# Patient Record
Sex: Female | Born: 1955 | Race: Black or African American | Hispanic: No | Marital: Married | Smoking: Never smoker
Health system: Southern US, Community
[De-identification: ages and names within clinical notes are randomized; demographics above are authoritative.]

## PROBLEM LIST (undated history)

## (undated) DIAGNOSIS — E785 Hyperlipidemia, unspecified: Secondary | ICD-10-CM

## (undated) DIAGNOSIS — I10 Essential (primary) hypertension: Secondary | ICD-10-CM

## (undated) DIAGNOSIS — N289 Disorder of kidney and ureter, unspecified: Secondary | ICD-10-CM

## (undated) DIAGNOSIS — E119 Type 2 diabetes mellitus without complications: Secondary | ICD-10-CM

## (undated) DIAGNOSIS — I639 Cerebral infarction, unspecified: Secondary | ICD-10-CM

## (undated) HISTORY — DX: Cerebral infarction, unspecified: I63.9

## (undated) HISTORY — PX: ABDOMINAL HYSTERECTOMY: SHX81

## (undated) HISTORY — DX: Type 2 diabetes mellitus without complications: E11.9

## (undated) HISTORY — DX: Hyperlipidemia, unspecified: E78.5

## (undated) HISTORY — PX: DIALYSIS FISTULA CREATION: SHX611

## (undated) HISTORY — DX: Disorder of kidney and ureter, unspecified: N28.9

## (undated) HISTORY — PX: TUBAL LIGATION: SHX77

## (undated) HISTORY — DX: Essential (primary) hypertension: I10

---

## 2005-12-24 HISTORY — PX: COLONOSCOPY: SHX174

## 2005-12-24 HISTORY — PX: ESOPHAGOGASTRODUODENOSCOPY: SHX1529

## 2012-02-24 DIAGNOSIS — I639 Cerebral infarction, unspecified: Secondary | ICD-10-CM

## 2012-02-24 HISTORY — DX: Cerebral infarction, unspecified: I63.9

## 2013-09-23 HISTORY — PX: COLONOSCOPY: SHX174

## 2014-02-23 HISTORY — PX: ESOPHAGOGASTRODUODENOSCOPY: SHX1529

## 2014-12-05 LAB — HEMOGLOBIN A1C: Hgb A1c MFr Bld: 12.6 % — AB (ref 4.0–6.0)

## 2014-12-07 LAB — HM DIABETES EYE EXAM

## 2015-01-07 ENCOUNTER — Ambulatory Visit (INDEPENDENT_AMBULATORY_CARE_PROVIDER_SITE_OTHER): Payer: BLUE CROSS/BLUE SHIELD | Admitting: "Endocrinology

## 2015-01-07 ENCOUNTER — Encounter: Payer: Self-pay | Admitting: "Endocrinology

## 2015-01-07 VITALS — BP 168/92 | HR 94 | Ht 64.0 in | Wt 165.0 lb

## 2015-01-07 DIAGNOSIS — N186 End stage renal disease: Secondary | ICD-10-CM

## 2015-01-07 DIAGNOSIS — I10 Essential (primary) hypertension: Secondary | ICD-10-CM

## 2015-01-07 DIAGNOSIS — E1122 Type 2 diabetes mellitus with diabetic chronic kidney disease: Secondary | ICD-10-CM | POA: Diagnosis not present

## 2015-01-07 DIAGNOSIS — E785 Hyperlipidemia, unspecified: Secondary | ICD-10-CM

## 2015-01-07 NOTE — Patient Instructions (Signed)

## 2015-01-07 NOTE — Progress Notes (Signed)
Subjective:    Patient ID: Selena Rowe, female    DOB: April 26, 1955,    No past medical history on file. No past surgical history on file. Social History   Social History  . Marital Status: Married    Spouse Name: N/A  . Number of Children: N/A  . Years of Education: N/A   Social History Main Topics  . Smoking status: Never Smoker   . Smokeless tobacco: Not on file  . Alcohol Use: No  . Drug Use: No  . Sexual Activity: Not on file   Other Topics Concern  . Not on file   Social History Narrative  . No narrative on file   No outpatient encounter prescriptions on file as of 01/07/2015.   No facility-administered encounter medications on file as of 01/07/2015.   ALLERGIES: No Known Allergies VACCINATION STATUS:  There is no immunization history on file for this patient.  Diabetes She presents for her follow-up diabetic visit. She has type 2 diabetes mellitus. Onset time: She was diagnosed at approximate age of 59 years preceded by history of gestational diabetes in 59. Her disease course has been worsening. There are no hypoglycemic associated symptoms. Pertinent negatives for hypoglycemia include no confusion, headaches, pallor or seizures. Associated symptoms include fatigue and polydipsia. Pertinent negatives for diabetes include no chest pain and no polyphagia. There are no hypoglycemic complications. Symptoms are worsening. Diabetic complications include a CVA, nephropathy and retinopathy. Risk factors for coronary artery disease include dyslipidemia, diabetes mellitus, hypertension and sedentary lifestyle. Current diabetic treatment includes insulin injections. She is compliant with treatment some of the time. She is following a generally unhealthy diet. She has had a previous visit with a dietitian. She rarely participates in exercise. Her home blood glucose trend is fluctuating dramatically (She did not bring any meter nor blood glucose logs to review.). An ACE  inhibitor/angiotensin II receptor blocker is being taken. Eye exam is current.  Hypertension This is a chronic problem. The current episode started more than 1 year ago. The problem is uncontrolled (Patient states she did not take her blood pressure medications this morning.). Pertinent negatives include no chest pain, headaches, palpitations or shortness of breath. Risk factors for coronary artery disease include dyslipidemia, diabetes mellitus and sedentary lifestyle. Past treatments include beta blockers, diuretics and ACE inhibitors. Hypertensive end-organ damage includes CVA and retinopathy.  Hyperlipidemia This is a chronic problem. The current episode started more than 1 year ago. The problem is uncontrolled. Pertinent negatives include no chest pain, myalgias or shortness of breath. Current antihyperlipidemic treatment includes statins. Risk factors for coronary artery disease include diabetes mellitus, dyslipidemia, hypertension and a sedentary lifestyle.     Review of Systems  Constitutional: Positive for fatigue. Negative for unexpected weight change.  HENT: Negative for trouble swallowing and voice change.   Eyes: Positive for visual disturbance.  Respiratory: Negative for cough, shortness of breath and wheezing.   Cardiovascular: Negative for chest pain, palpitations and leg swelling.  Gastrointestinal: Negative for nausea, vomiting and diarrhea.  Endocrine: Positive for polydipsia. Negative for cold intolerance, heat intolerance and polyphagia.  Musculoskeletal: Negative for myalgias and arthralgias.  Skin: Negative for color change, pallor, rash and wound.  Neurological: Negative for seizures and headaches.  Psychiatric/Behavioral: Negative for suicidal ideas and confusion.    Objective:    BP 168/92 mmHg  Pulse 94  Ht 5\' 4"  (1.626 m)  Wt 165 lb (74.844 kg)  BMI 28.31 kg/m2  SpO2 98%  Wt Readings from  Last 3 Encounters:  01/07/15 165 lb (74.844 kg)    Physical Exam   Constitutional: She is oriented to person, place, and time. She appears well-developed.  HENT:  Head: Normocephalic and atraumatic.  Eyes: EOM are normal.  Neck: Normal range of motion. Neck supple. No tracheal deviation present. No thyromegaly present.  Cardiovascular: Normal rate and regular rhythm.   Pulmonary/Chest: Effort normal and breath sounds normal.  Abdominal: Soft. Bowel sounds are normal. There is no tenderness. There is no guarding.  Musculoskeletal: Normal range of motion. She exhibits no edema.  Neurological: She is alert and oriented to person, place, and time. She has normal reflexes. No cranial nerve deficit. Coordination normal.  Skin: Skin is warm and dry. No rash noted. No erythema. No pallor.  Psychiatric: She has a normal mood and affect. Judgment normal.    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  - Patient has currently uncontrolled symptomatic type 2 DM since  59 years of age,  with most recent A1c of 12.6 %. Recent labs reviewed.   Her diabetes is complicated by end-stage renal disease on dialysis , retinopathy, and cerebrovascular accident and patient remains at a high risk for more acute and chronic complications of diabetes which include CAD, CVA, CKD, retinopathy, and neuropathy. These are all discussed in detail with the patient.  - I have counseled the patient on diet management and weight loss, by adopting a carbohydrate restricted/protein rich diet.  - Suggestion is made for patient to avoid simple carbohydrates   from their diet including Cakes , Desserts, Ice Cream,  Soda (  diet and regular) , Sweet Tea , Candies,  Chips, Cookies, Artificial Sweeteners,   and "Sugar-free" Products . This will help patient to have stable blood glucose profile and potentially avoid unintended weight gain.  - I encouraged the  patient to switch to  unprocessed or minimally processed complex starch and increased protein intake (animal or plant source), fruits, and vegetables.  - Patient is advised to stick to a routine mealtimes to eat 3 meals  a day and avoid unnecessary snacks ( to snack only to correct hypoglycemia).  - The patient will be scheduled with Jearld Fenton, RDN, CDE for individualized DM education.  - I have approached patient with the following individualized plan to manage diabetes and patient agrees:   - I  will proceed to adjust her basal insulin to Levemir 60 units QHS, and prandial insulin NovoLog 15 units TIDAC for pre-meal BG readings of 90-150mg /dl, plus patient specific correction dose for unexpected hyperglycemia above 150mg /dl, associated with strict monitoring of glucose  AC and HS. - Patient is warned not to take insulin without proper monitoring per orders. -Adjustment parameters are given for hypo and hyperglycemia in writing. -Patient is encouraged to call clinic for blood glucose levels less than 70 or above 300 mg /dl.  -Patient is not a candidate for metformin andSGLT2 inhibitors due to CKD.  - Patient will be considered for incretin therapy as appropriate next visit. - Patient specific target  A1c;  LDL, HDL, Triglycerides, and  Waist Circumference were discussed in detail.  2) BP/HTN: Uncontrolled due to the fact that she did not take her blood pressure medications this morning. I advised her to continue carvedilol 12.5 mg by mouth twice a day lisinopril 10 mg by mouth daily. 3) Lipids/HPL:  Controlled unknown , continue  statins. 4)  Weight/Diet: CDE Consult will be initiated , exercise, and detailed carbohydrates information provided.  5) Chronic Care/Health Maintenance:  -Patient is on ACEI/ARB and Statin medications and encouraged to continue to follow up with Ophthalmology, Podiatrist at least yearly or according to recommendations, and advised to   stay away from smoking. I  have recommended yearly flu vaccine and pneumonia vaccination at least every 5 years; moderate intensity exercise for up to 150 minutes weekly; and  sleep for at least 7 hours a day.   Patient to bring meter and  blood glucose logs during their next visit.   I advised patient to maintain close follow up with their PCP for primary care needs. Follow up plan: No Follow-up on file.  Glade Lloyd, MD Phone: [phone]  Fax: [phone]   01/07/2015, 8:44 AM

## 2015-01-14 ENCOUNTER — Encounter: Payer: Self-pay | Admitting: "Endocrinology

## 2015-01-14 ENCOUNTER — Ambulatory Visit (INDEPENDENT_AMBULATORY_CARE_PROVIDER_SITE_OTHER): Payer: BLUE CROSS/BLUE SHIELD | Admitting: "Endocrinology

## 2015-01-14 VITALS — BP 166/90 | HR 86 | Ht 64.0 in | Wt 169.0 lb

## 2015-01-14 DIAGNOSIS — E782 Mixed hyperlipidemia: Secondary | ICD-10-CM | POA: Insufficient documentation

## 2015-01-14 DIAGNOSIS — N186 End stage renal disease: Secondary | ICD-10-CM | POA: Diagnosis not present

## 2015-01-14 DIAGNOSIS — E785 Hyperlipidemia, unspecified: Secondary | ICD-10-CM | POA: Diagnosis not present

## 2015-01-14 DIAGNOSIS — E1122 Type 2 diabetes mellitus with diabetic chronic kidney disease: Secondary | ICD-10-CM | POA: Insufficient documentation

## 2015-01-14 DIAGNOSIS — I10 Essential (primary) hypertension: Secondary | ICD-10-CM | POA: Diagnosis not present

## 2015-01-14 NOTE — Patient Instructions (Signed)

## 2015-01-14 NOTE — Progress Notes (Signed)
Subjective:    Patient ID: Selena Rowe, female    DOB: Jun 03, 1955,    Past Medical History  Diagnosis Date  . Diabetes mellitus, type II ([place])   . Hypertension   . Hyperlipidemia   . Stroke ([place])   . Kidney disease    Past Surgical History  Procedure Laterality Date  . Cesarean section    . Abdominal hysterectomy    . Dialysis fistula creation     Social History   Social History  . Marital Status: Married    Spouse Name: N/A  . Number of Children: N/A  . Years of Education: N/A   Social History Main Topics  . Smoking status: Never Smoker   . Smokeless tobacco: None  . Alcohol Use: No  . Drug Use: No  . Sexual Activity: Not Asked   Other Topics Concern  . None   Social History Narrative   Outpatient Encounter Prescriptions as of 01/14/2015  Medication Sig  . aspirin EC 81 MG tablet Take 81 mg by mouth daily.  Marland Kitchen atorvastatin (LIPITOR) 40 MG tablet Take 40 mg by mouth daily.  . calcium acetate (PHOSLO) 667 MG capsule Take by mouth 3 (three) times daily with meals.  . carvedilol (COREG) 12.5 MG tablet Take 12.5 mg by mouth 2 (two) times daily with a meal.  . ergocalciferol (VITAMIN D2) 50000 UNITS capsule Take 50,000 Units by mouth once a week.  . furosemide (LASIX) 40 MG tablet Take 40 mg by mouth 2 (two) times daily.  . insulin aspart (NOVOLOG FLEXPEN) 100 UNIT/ML FlexPen Inject 10-16 Units into the skin 3 (three) times daily with meals.  . Insulin Detemir (LEVEMIR FLEXPEN) 100 UNIT/ML Pen Inject 60 Units into the skin at bedtime.  Marland Kitchen lisinopril (PRINIVIL,ZESTRIL) 40 MG tablet Take 40 mg by mouth daily.  . multivitamin (RENA-VIT) TABS tablet Take 1 tablet by mouth daily.  . vitamin B-12 (CYANOCOBALAMIN) 500 MCG tablet Take 500 mcg by mouth daily.   No facility-administered encounter medications on file as of 01/14/2015.   ALLERGIES: No Known Allergies VACCINATION STATUS:  There is no immunization history on file for this patient.  Diabetes She presents  for her follow-up diabetic visit. She has type 2 diabetes mellitus. Onset time: She was diagnosed at approximate age of 23 years preceded by history of gestational diabetes in 87. Her disease course has been fluctuating. There are no hypoglycemic associated symptoms. Pertinent negatives for hypoglycemia include no confusion, headaches, pallor or seizures. (She had one episode of hypoglycemia to 38 associated with symptoms including confusion.) Associated symptoms include fatigue and polydipsia. Pertinent negatives for diabetes include no chest pain and no polyphagia. There are no hypoglycemic complications. Symptoms are worsening. Diabetic complications include a CVA, nephropathy and retinopathy. Risk factors for coronary artery disease include dyslipidemia, diabetes mellitus, hypertension and sedentary lifestyle. Current diabetic treatment includes insulin injections. She is compliant with treatment some of the time. Her weight is stable. She is following a generally unhealthy diet. She has had a previous visit with a dietitian. She rarely participates in exercise. Her home blood glucose trend is fluctuating dramatically (She did not bring any meter nor blood glucose logs to review.). An ACE inhibitor/angiotensin II receptor blocker is being taken. Eye exam is current.  Hypertension This is a chronic problem. The current episode started more than 1 year ago. The problem is uncontrolled (Patient states she did not take her blood pressure medications this morning.). Pertinent negatives include no chest pain, headaches, palpitations  or shortness of breath. Risk factors for coronary artery disease include dyslipidemia, diabetes mellitus and sedentary lifestyle. Past treatments include beta blockers, diuretics and ACE inhibitors. Hypertensive end-organ damage includes CVA and retinopathy.  Hyperlipidemia This is a chronic problem. The current episode started more than 1 year ago. The problem is uncontrolled.  Pertinent negatives include no chest pain, myalgias or shortness of breath. Current antihyperlipidemic treatment includes statins. Risk factors for coronary artery disease include diabetes mellitus, dyslipidemia, hypertension and a sedentary lifestyle.     Review of Systems  Constitutional: Positive for fatigue. Negative for unexpected weight change.  HENT: Negative for trouble swallowing and voice change.   Eyes: Positive for visual disturbance.  Respiratory: Negative for cough, shortness of breath and wheezing.   Cardiovascular: Negative for chest pain, palpitations and leg swelling.  Gastrointestinal: Negative for nausea, vomiting and diarrhea.  Endocrine: Positive for polydipsia. Negative for cold intolerance, heat intolerance and polyphagia.  Musculoskeletal: Negative for myalgias and arthralgias.  Skin: Negative for color change, pallor, rash and wound.  Neurological: Negative for seizures and headaches.  Psychiatric/Behavioral: Negative for suicidal ideas and confusion.    Objective:    BP 166/90 mmHg  Pulse 86  Ht 5\' 4"  (1.626 m)  Wt 169 lb (76.658 kg)  BMI 28.99 kg/m2  SpO2 96%  Wt Readings from Last 3 Encounters:  01/14/15 169 lb (76.658 kg)  01/07/15 165 lb (74.844 kg)    Physical Exam  Constitutional: She is oriented to person, place, and time. She appears well-developed.  HENT:  Head: Normocephalic and atraumatic.  Eyes: EOM are normal.  Neck: Normal range of motion. Neck supple. No tracheal deviation present. No thyromegaly present.  Cardiovascular: Normal rate and regular rhythm.   Pulmonary/Chest: Effort normal and breath sounds normal.  Abdominal: Soft. Bowel sounds are normal. There is no tenderness. There is no guarding.  Musculoskeletal: Normal range of motion. She exhibits no edema.  Neurological: She is alert and oriented to person, place, and time. She has normal reflexes. No cranial nerve deficit. Coordination normal.  Skin: Skin is warm and dry. No  rash noted. No erythema. No pallor.  Psychiatric: She has a normal mood and affect. Judgment normal.    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  - Patient has currently uncontrolled symptomatic type 2 DM since  59 years of age,  with most recent A1c of 12.6 %. Recent labs reviewed.   Her diabetes is complicated by end-stage renal disease on dialysis , retinopathy, and cerebrovascular accident and patient remains at a high risk for more acute and chronic complications of diabetes which include CAD, CVA, CKD, retinopathy, and neuropathy. These are all discussed in detail with the patient.  - I have counseled the patient on diet management and weight loss, by adopting a carbohydrate restricted/protein rich diet.  - Suggestion is made for patient to avoid simple carbohydrates   from their diet including Cakes , Desserts, Ice Cream,  Soda (  diet and regular) , Sweet Tea , Candies,  Chips, Cookies, Artificial Sweeteners,   and "Sugar-free" Products . This will help patient to have stable blood glucose profile and potentially avoid unintended weight gain.  - I encouraged the patient to switch to  unprocessed or minimally processed complex starch and increased protein intake (animal or plant source), fruits, and vegetables.  - Patient is advised to stick to a  routine mealtimes to eat 3 meals  a day and avoid unnecessary snacks ( to snack only to correct hypoglycemia).  - The patient will be scheduled with Jearld Fenton, RDN, CDE for individualized DM education.  - I have approached patient with the following individualized plan to manage diabetes and patient agrees:   - I  will proceed with Levemir 60 units QHS, and lower  NovoLog to 10 units [facility] for pre-meal BG readings of 90-150mg /dl, plus patient specific correction dose for unexpected hyperglycemia above  150mg /dl, associated with strict monitoring of glucose  AC and HS. - Patient is warned not to take insulin without proper monitoring per orders. -Adjustment parameters are given for hypo and hyperglycemia in writing. -Patient is encouraged to call clinic for blood glucose levels less than 70 or above 300 mg /dl.  -Patient is not a candidate for metformin andSGLT2 inhibitors due to CKD.  - Patient will be considered for incretin therapy as appropriate next visit. - Patient specific target  A1c;  LDL, HDL, Triglycerides, and  Waist Circumference were discussed in detail.  2) BP/HTN: Uncontrolled due to the fact that she did not take her blood pressure medications this morning. I advised her to continue carvedilol 12.5 mg by mouth twice a day lisinopril 10 mg by mouth daily. 3) Lipids/HPL:  Controlled unknown , continue statins. 4)  Weight/Diet: CDE Consult will be initiated , exercise, and detailed carbohydrates information provided.  5) Chronic Care/Health Maintenance:  -Patient is on ACEI/ARB and Statin medications and encouraged to continue to follow up with Ophthalmology, Podiatrist at least yearly or according to recommendations, and advised to   stay away from smoking. I have recommended yearly flu vaccine and pneumonia vaccination at least every 5 years; moderate intensity exercise for up to 150 minutes weekly; and  sleep for at least 7 hours a day.   Patient to bring meter and  blood glucose logs during their next visit.   I advised patient to maintain close follow up with their PCP for primary care needs. Follow up plan: Return in about 2 weeks (around 01/28/2015) for diabetes, high blood pressure, high cholesterol, follow up with meter and logs- no labs.  Glade Lloyd, MD Phone: [phone]  Fax: [phone]   01/14/2015, 7:39 PM

## 2015-01-30 ENCOUNTER — Ambulatory Visit (INDEPENDENT_AMBULATORY_CARE_PROVIDER_SITE_OTHER): Payer: Medicare Other | Admitting: "Endocrinology

## 2015-01-30 ENCOUNTER — Encounter: Payer: Self-pay | Admitting: "Endocrinology

## 2015-01-30 VITALS — BP 149/79 | HR 88 | Ht 64.0 in | Wt 169.0 lb

## 2015-01-30 DIAGNOSIS — N184 Chronic kidney disease, stage 4 (severe): Secondary | ICD-10-CM

## 2015-01-30 DIAGNOSIS — N186 End stage renal disease: Secondary | ICD-10-CM | POA: Diagnosis not present

## 2015-01-30 DIAGNOSIS — E1122 Type 2 diabetes mellitus with diabetic chronic kidney disease: Secondary | ICD-10-CM | POA: Insufficient documentation

## 2015-01-30 DIAGNOSIS — I10 Essential (primary) hypertension: Secondary | ICD-10-CM

## 2015-01-30 DIAGNOSIS — E785 Hyperlipidemia, unspecified: Secondary | ICD-10-CM

## 2015-01-30 MED ORDER — INSULIN DETEMIR 100 UNIT/ML FLEXPEN: 60.0000 [IU] | PEN_INJECTOR | Freq: Every day | SUBCUTANEOUS | Status: DC

## 2015-01-30 MED ORDER — INSULIN ASPART 100 UNIT/ML FLEXPEN: 12.0000 [IU] | PEN_INJECTOR | Freq: Three times a day (TID) | SUBCUTANEOUS | Status: DC

## 2015-01-30 NOTE — Patient Instructions (Signed)

## 2015-01-30 NOTE — Progress Notes (Signed)
Subjective:    Patient ID: Selena Rowe, female    DOB: 01-08-1956,    Past Medical History  Diagnosis Date  . Diabetes mellitus, type II ([place])   . Hypertension   . Hyperlipidemia   . Stroke ([place])   . Kidney disease    Past Surgical History  Procedure Laterality Date  . Cesarean section    . Abdominal hysterectomy    . Dialysis fistula creation     Social History   Social History  . Marital Status: Married    Spouse Name: N/A  . Number of Children: N/A  . Years of Education: N/A   Social History Main Topics  . Smoking status: Never Smoker   . Smokeless tobacco: None  . Alcohol Use: No  . Drug Use: No  . Sexual Activity: Not Asked   Other Topics Concern  . None   Social History Narrative   Outpatient Encounter Prescriptions as of 01/30/2015  Medication Sig  . aspirin EC 81 MG tablet Take 81 mg by mouth daily.  Marland Kitchen atorvastatin (LIPITOR) 40 MG tablet Take 40 mg by mouth daily.  . calcium acetate (PHOSLO) 667 MG capsule Take by mouth 3 (three) times daily with meals.  . carvedilol (COREG) 12.5 MG tablet Take 12.5 mg by mouth 2 (two) times daily with a meal.  . ergocalciferol (VITAMIN D2) 50000 UNITS capsule Take 50,000 Units by mouth once a week.  . furosemide (LASIX) 40 MG tablet Take 40 mg by mouth 2 (two) times daily.  . insulin aspart (NOVOLOG FLEXPEN) 100 UNIT/ML FlexPen Inject 12-18 Units into the skin 3 (three) times daily with meals.  . Insulin Detemir (LEVEMIR FLEXPEN) 100 UNIT/ML Pen Inject 60 Units into the skin at bedtime.  Marland Kitchen lisinopril (PRINIVIL,ZESTRIL) 40 MG tablet Take 40 mg by mouth daily.  . multivitamin (RENA-VIT) TABS tablet Take 1 tablet by mouth daily.  . vitamin B-12 (CYANOCOBALAMIN) 500 MCG tablet Take 500 mcg by mouth daily.  . [DISCONTINUED] insulin aspart (NOVOLOG FLEXPEN) 100 UNIT/ML FlexPen Inject 12-18 Units into the skin 3 (three) times daily with meals.  . [DISCONTINUED] Insulin Detemir (LEVEMIR FLEXPEN) 100 UNIT/ML Pen Inject 60  Units into the skin at bedtime.   No facility-administered encounter medications on file as of 01/30/2015.   ALLERGIES: No Known Allergies VACCINATION STATUS:  There is no immunization history on file for this patient.  Diabetes She presents for her follow-up diabetic visit. She has type 2 diabetes mellitus. Onset time: She was diagnosed at approximate age of 6 years preceded by history of gestational diabetes in 20. Her disease course has been improving. There are no hypoglycemic associated symptoms. Pertinent negatives for hypoglycemia include no confusion, headaches, pallor or seizures. (She had one episode of hypoglycemia to 40 associated with symptoms including confusion.) Associated symptoms include fatigue and polydipsia. Pertinent negatives for diabetes include no chest pain and no polyphagia. There are no hypoglycemic complications. Symptoms are improving. Diabetic complications include a CVA, nephropathy and retinopathy. Risk factors for coronary artery disease include dyslipidemia, diabetes mellitus, hypertension and sedentary lifestyle. Current diabetic treatment includes insulin injections. She is compliant with treatment some of the time. Her weight is stable. She is following a generally unhealthy diet. She has had a previous visit with a dietitian. She rarely participates in exercise. Her home blood glucose trend is fluctuating dramatically. Her overall blood glucose range is >200 mg/dl. An ACE inhibitor/angiotensin II receptor blocker is being taken. Eye exam is current.  Hypertension This is  a chronic problem. The current episode started more than 1 year ago. The problem is uncontrolled (Patient states she did not take her blood pressure medications this morning.). Pertinent negatives include no chest pain, headaches, palpitations or shortness of breath. Risk factors for coronary artery disease include dyslipidemia, diabetes mellitus and sedentary lifestyle. Past treatments include  beta blockers, diuretics and ACE inhibitors. Hypertensive end-organ damage includes CVA and retinopathy.  Hyperlipidemia This is a chronic problem. The current episode started more than 1 year ago. The problem is uncontrolled. Pertinent negatives include no chest pain, myalgias or shortness of breath. Current antihyperlipidemic treatment includes statins. Risk factors for coronary artery disease include diabetes mellitus, dyslipidemia, hypertension and a sedentary lifestyle.     Review of Systems  Constitutional: Positive for fatigue. Negative for unexpected weight change.  HENT: Negative for trouble swallowing and voice change.   Eyes: Positive for visual disturbance.  Respiratory: Negative for cough, shortness of breath and wheezing.   Cardiovascular: Negative for chest pain, palpitations and leg swelling.  Gastrointestinal: Negative for nausea, vomiting and diarrhea.  Endocrine: Positive for polydipsia. Negative for cold intolerance, heat intolerance and polyphagia.  Musculoskeletal: Negative for myalgias and arthralgias.  Skin: Negative for color change, pallor, rash and wound.  Neurological: Negative for seizures and headaches.  Psychiatric/Behavioral: Negative for suicidal ideas and confusion.    Objective:    BP 149/79 mmHg  Pulse 88  Ht 5\' 4"  (1.626 m)  Wt 169 lb (76.658 kg)  BMI 28.99 kg/m2  SpO2 98%  Wt Readings from Last 3 Encounters:  01/30/15 169 lb (76.658 kg)  01/14/15 169 lb (76.658 kg)  01/07/15 165 lb (74.844 kg)    Physical Exam  Constitutional: She is oriented to person, place, and time. She appears well-developed.  HENT:  Head: Normocephalic and atraumatic.  Eyes: EOM are normal.  Neck: Normal range of motion. Neck supple. No tracheal deviation present. No thyromegaly present.  Cardiovascular: Normal rate and regular rhythm.   Pulmonary/Chest: Effort normal and breath sounds normal.  Abdominal: Soft. Bowel sounds are normal. There is no tenderness. There  is no guarding.  Musculoskeletal: Normal range of motion. She exhibits no edema.  Neurological: She is alert and oriented to person, place, and time. She has normal reflexes. No cranial nerve deficit. Coordination normal.  Skin: Skin is warm and dry. No rash noted. No erythema. No pallor.  Psychiatric: She has a normal mood and affect. Judgment normal.    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 -She came with better but still significantly fluctuating blood glucose profile. -She has not been consistent on timing of insulin and meals. - Patient has currently uncontrolled symptomatic type 2 DM since  59 years of age,  with most recent A1c of 12.6 %.  -She verbally reports that her latest A1c has improved to 10%, reports not available for review.  Her diabetes is complicated by end-stage renal disease on dialysis , retinopathy, and cerebrovascular accident and patient remains at a high risk for more acute and chronic complications of diabetes which include CAD, CVA, CKD, retinopathy, and neuropathy. These are all discussed in detail with the patient.  - I have counseled the patient on diet management and weight loss, by adopting a carbohydrate restricted/protein rich diet.  - Suggestion is made for patient to avoid simple carbohydrates   from their diet including Cakes , Desserts, Ice  Cream,  Soda (  diet and regular) , Sweet Tea , Candies,  Chips, Cookies, Artificial Sweeteners,   and "Sugar-free" Products . This will help patient to have stable blood glucose profile and potentially avoid unintended weight gain.  - I encouraged the patient to switch to  unprocessed or minimally processed complex starch and increased protein intake (animal or plant source), fruits, and vegetables.  - Patient is advised to stick to a routine mealtimes to eat 3 meals  a day and  avoid unnecessary snacks ( to snack only to correct hypoglycemia).  - The patient will be scheduled with Jearld Fenton, RDN, CDE for individualized DM education.  - I have approached patient with the following individualized plan to manage diabetes and patient agrees:   - I  will continue with Levemir 60 units QHS, and increase   NovoLog to 12 units [facility] for pre-meal BG readings of 90-150mg /dl, plus patient specific correction dose for unexpected hyperglycemia above 150mg /dl, associated with strict monitoring of glucose  AC and HS. - Patient is warned not to take insulin without proper monitoring per orders. -Adjustment parameters are given for hypo and hyperglycemia in writing. -Patient is encouraged to call clinic for blood glucose levels less than 70 or above 300 mg /dl.  -Patient is not a candidate for metformin andSGLT2 inhibitors due to CKD.  - Patient will be considered for incretin therapy as appropriate next visit. - Patient specific target  A1c;  LDL, HDL, Triglycerides, and  Waist Circumference were discussed in detail.  2) BP/HTN: Uncontrolled due to the fact that she did not take her blood pressure medications this morning. I advised her to continue carvedilol 12.5 mg by mouth twice a day lisinopril 10 mg by mouth daily. 3) Lipids/HPL:  Controlled unknown , continue statins. 4)  Weight/Diet: CDE Consult will be initiated , exercise, and detailed carbohydrates information provided.  5) Chronic Care/Health Maintenance:  -Patient is on ACEI/ARB and Statin medications and encouraged to continue to follow up with Ophthalmology, Podiatrist at least yearly or according to recommendations, and advised to   stay away from smoking. I have recommended yearly flu vaccine and pneumonia vaccination at least every 5 years; moderate intensity exercise for up to 150 minutes weekly; and  sleep for at least 7 hours a day.   Patient to bring meter and  blood glucose logs during their next  visit.   I advised patient to maintain close follow up with their PCP for primary care needs. Follow up plan: Return in about 3 months (around 04/30/2015) for diabetes, high blood pressure, high cholesterol, follow up with pre-visit labs, meter, and logs.  Glade Lloyd, MD Phone: [phone]  Fax: [phone]   01/30/2015, 5:01 PM

## 2015-02-13 ENCOUNTER — Encounter: Payer: Self-pay | Admitting: Nutrition

## 2015-02-13 ENCOUNTER — Encounter: Payer: BLUE CROSS/BLUE SHIELD | Attending: "Endocrinology | Admitting: Nutrition

## 2015-02-13 VITALS — Ht 65.0 in | Wt 167.0 lb

## 2015-02-13 DIAGNOSIS — I10 Essential (primary) hypertension: Secondary | ICD-10-CM | POA: Diagnosis not present

## 2015-02-13 DIAGNOSIS — E785 Hyperlipidemia, unspecified: Secondary | ICD-10-CM | POA: Diagnosis not present

## 2015-02-13 DIAGNOSIS — N186 End stage renal disease: Secondary | ICD-10-CM | POA: Diagnosis not present

## 2015-02-13 DIAGNOSIS — Z713 Dietary counseling and surveillance: Secondary | ICD-10-CM | POA: Insufficient documentation

## 2015-02-13 DIAGNOSIS — Z794 Long term (current) use of insulin: Secondary | ICD-10-CM | POA: Insufficient documentation

## 2015-02-13 DIAGNOSIS — E1121 Type 2 diabetes mellitus with diabetic nephropathy: Secondary | ICD-10-CM

## 2015-02-13 NOTE — Patient Instructions (Addendum)
Goals: 1. Follow My Plate Method  Eat 2-3 carb choice per meal. 2. Cut out all sodas and drink only water 3. Avoid foods high in potassium, phosphorus and sodium 4. Exercise 30 minutes 3-4 times per week. 5. Cut out snacks unless a protein or low carb veggie. 6. Get A1C down to 9% in three months.

## 2015-02-13 NOTE — Progress Notes (Signed)
  Medical Nutrition Therapy:  Appt start time: 0800 end time:  0930.   Assessment:  Primary concerns today: Diabetes.Type 2 DM. She lives with her husband. She and her husband shop and cook together. Most foods are baked. Most recent A1C 10%. 60 units Levemir and 12 units Novolog with meals and sliding scales. Had a stroke in 2014. Started Peritoneal Dialysis of June 2116  6-7 hrs daily at night. Downloaded meter. Diet is excessive carbs  And higher in sodium contributing to her higher A1C and low in fresh fruits and lower carb vegetables. Trying to follow renal diet as well due to her dialysis. Working on getting a kidney transplant. Just went to [facility] yesterday for work up. Sees Dr. Dorris Fetch for her DM.  Preferred Learning Style  Visual  Learning Readiness:  Ready  Change in progress   MEDICATIONS: See list   DIETARY INTAKE:   24-hr recall:  B ( AM): Boiled eggs 2, coffee and 2 slices bacon , water Snk ( AM): none L ( PM): Toss salad with chicken, OR sandwich-turkey on white or wheat, [place] Regular, water Snk ( PM): none D ( PM): Pot pie from [employer], Sweet Tea 50/30 Snk ( PM): fruit or oatmeal or popcorn Beverages: water, sodas  Usual physical activity:   Estimated energy needs: 1500 calories 170 g carbohydrates 112 g protein 42 g fat  Progress Towards Goal(s):  In progress.   Nutritional Diagnosis:  NB-1.1 Food and nutrition-related knowledge deficit As related to Dm.  As evidenced by >10%.    Intervention: Nutrition and Diabetes education provided on My Plate, CHO counting, meal planning, portion sizes, timing of meals, avoiding snacks between meals unless having a low blood sugar, target ranges for A1C and blood sugars, signs/symptoms and treatment of hyper/hypoglycemia, monitoring blood sugars, taking medications as prescribed, benefits of exercising 30 minutes per day and prevention of complications of DM.  Goals: 1. Follow My Plate Method  Eat 2-3 carb choice per  meal. 2. Cut out all sodas and drink only water 3. Avoid foods high in potassium, phosphorus and sodium 4. Exercise 30 minutes 3-4 times per week. 5. Cut out snacks unless a protein or low carb veggie. 6. Get A1C down to 9% in three months.   Teaching Method Utilized:  Visual Auditory Hands on  Handouts given during visit include:  The Plate Method  Meal Plan Card  Renal Diet  Barriers to learning/adherence to lifestyle change: None  Demonstrated degree of understanding via:  Teach Back   Monitoring/Evaluation:  Dietary intake, exercise, meal planning, SBG, and body weight in 1 month(s).

## 2015-03-08 ENCOUNTER — Other Ambulatory Visit: Payer: Self-pay

## 2015-03-08 MED ORDER — INSULIN LISPRO 100 UNIT/ML (KWIKPEN): 12.0000 [IU] | PEN_INJECTOR | Freq: Three times a day (TID) | SUBCUTANEOUS | Status: DC

## 2015-03-11 ENCOUNTER — Encounter: Payer: Self-pay | Admitting: "Endocrinology

## 2015-03-13 ENCOUNTER — Other Ambulatory Visit: Payer: Self-pay | Admitting: "Endocrinology

## 2015-03-20 ENCOUNTER — Encounter: Payer: Medicare Other | Attending: "Endocrinology | Admitting: Nutrition

## 2015-03-20 VITALS — Ht 65.0 in | Wt 171.0 lb

## 2015-03-20 DIAGNOSIS — N186 End stage renal disease: Secondary | ICD-10-CM | POA: Insufficient documentation

## 2015-03-20 DIAGNOSIS — Z794 Long term (current) use of insulin: Secondary | ICD-10-CM | POA: Insufficient documentation

## 2015-03-20 DIAGNOSIS — E1122 Type 2 diabetes mellitus with diabetic chronic kidney disease: Secondary | ICD-10-CM

## 2015-03-20 DIAGNOSIS — Z992 Dependence on renal dialysis: Secondary | ICD-10-CM

## 2015-03-20 DIAGNOSIS — E1121 Type 2 diabetes mellitus with diabetic nephropathy: Secondary | ICD-10-CM | POA: Insufficient documentation

## 2015-03-20 DIAGNOSIS — E785 Hyperlipidemia, unspecified: Secondary | ICD-10-CM | POA: Insufficient documentation

## 2015-03-20 DIAGNOSIS — Z713 Dietary counseling and surveillance: Secondary | ICD-10-CM | POA: Diagnosis not present

## 2015-03-20 DIAGNOSIS — I10 Essential (primary) hypertension: Secondary | ICD-10-CM | POA: Diagnosis not present

## 2015-03-20 NOTE — Progress Notes (Signed)
She does peritoneal dialysis daily. She notes she often times forgets to take her meal time insulin due to being busy. Since she can't remember all the times, she doesn't take it to prevent from taking too much and then realizes she didn't take it when she sees her BS > 200.Marland Kitchen Now walking a mile a day. Feels better. Has made much better food choices and following the renal diet much better. Waiting to get on transplant list.

## 2015-03-20 NOTE — Progress Notes (Signed)
  Medical Nutrition Therapy:  Appt start time: 0800 end time:  0930.   Assessment:  Primary concerns today: Diabetes.Type 2 DM.  Eating better and cooking more at home. BSand now walking 1 a day everyday.  She lives with her husband. She and her husband shop and cook together. Most foods are baked. Most recent A1C 10%. 60 units Levemir and 12 units Novolog with meals and sliding scales. Had a stroke in 2014. Started Peritoneal Dialysis of June 2116  6-7 hrs daily at night. Downloaded meter. Diet is excessive carbs  And higher in sodium contributing to her higher A1C and low in fresh fruits and lower carb vegetables. Trying to follow renal diet as well due to her dialysis. Working on getting a kidney transplant. Just went to [facility] yesterday for work up. Sees Dr. Dorris Fetch for her DM.  Preferred Learning Style  Visual  Learning Readiness:  Ready  Change in progress   MEDICATIONS: See list   DIETARY INTAKE:   24-hr recall:  B ( AM): Boiled eggs 2, coffee and 2 slices [place] bacon , water Snk ( AM): none L ( PM):  Toss salad and [place] sandwich on wheat bread, water  Or Snk ( PM): none D ( PM): Chicken, cabbage and carrots.  Snk ( PM):   Beverages: water, sodas  Usual physical activity:   Estimated energy needs: 1500 calories 170 g carbohydrates 112 g protein 42 g fat  Progress Towards Goal(s):  In progress.   Nutritional Diagnosis:  NB-1.1 Food and nutrition-related knowledge deficit As related to Dm.  As evidenced by >10%.    Intervention: Nutrition and Diabetes education provided on My Plate, CHO counting, meal planning, portion sizes, timing of meals, avoiding snacks between meals unless having a low blood sugar, target ranges for A1C and blood sugars, signs/symptoms and treatment of hyper/hypoglycemia, monitoring blood sugars, taking medications as prescribed, benefits of exercising 30 minutes per day and prevention of complications of DM.  Goals: 1. Follow My Plate Method  Eat 2-3 carb choice per meal. 2. Cut out all sodas and drink only water 3. Avoid foods high in potassium, phosphorus and sodium 4. Exercise 30 minutes 3-4 times per week. 5. Cut out snacks unless a protein or low carb veggie. 6. Get A1C down to 9% in three months.  7. Call about insulin coverage on insurance plans.  Teaching Method Utilized:  Visual Auditory Hands on  Handouts given during visit include:  The Plate Method  Meal Plan Card  Renal Diet  Barriers to learning/adherence to lifestyle change: None  Demonstrated degree of understanding via:  Teach Back   Monitoring/Evaluation:  Dietary intake, exercise, meal planning, SBG, and body weight in 1 month(s).

## 2015-03-20 NOTE — Patient Instructions (Addendum)
  Goals: 1. Follow My Plate Method  Eat 2-3 carb choice per meal. 2. Cut out all sodas and drink only water 3. Avoid foods high in potassium, phosphorus and sodium 4. Exercise 30 minutes 3-4 times per week. 5. Cut out snacks unless a protein or low carb veggie. 6. Get A1C down to 9% in three months.  7. Call about insulin coverage on insurance plans.

## 2015-03-29 ENCOUNTER — Other Ambulatory Visit: Payer: Self-pay

## 2015-03-29 NOTE — Telephone Encounter (Signed)
Pt states that her Novolog needs to be changed to Humalog 200units/ml per her insurance. She states that she still has 2 pens of the Novolog left. I told her to let the pharmacy or our office know about a week prior to running out. At that time we can switch this.

## 2015-04-19 ENCOUNTER — Other Ambulatory Visit: Payer: Self-pay

## 2015-04-19 MED ORDER — INSULIN LISPRO 100 UNIT/ML (KWIKPEN): 12.0000 [IU] | PEN_INJECTOR | Freq: Three times a day (TID) | SUBCUTANEOUS | Status: DC

## 2015-04-29 ENCOUNTER — Other Ambulatory Visit: Payer: Self-pay | Admitting: "Endocrinology

## 2015-04-29 LAB — BASIC METABOLIC PANEL
BUN: 40 mg/dL — ABNORMAL HIGH (ref 7–25)
CALCIUM: 8.7 mg/dL (ref 8.6–10.4)
CO2: 25 mmol/L (ref 20–31)
CREATININE: 6.63 mg/dL — AB (ref 0.50–0.99)
Chloride: 102 mmol/L (ref 98–110)
Glucose, Bld: 145 mg/dL — ABNORMAL HIGH (ref 65–99)
Potassium: 4 mmol/L (ref 3.5–5.3)
SODIUM: 141 mmol/L (ref 135–146)

## 2015-04-29 LAB — HEMOGLOBIN A1C
Hgb A1c MFr Bld: 8.2 % — ABNORMAL HIGH (ref <5.7)
MEAN PLASMA GLUCOSE: 189 mg/dL — AB (ref <117)

## 2015-04-30 ENCOUNTER — Ambulatory Visit: Payer: Medicare Other | Admitting: "Endocrinology

## 2015-05-03 ENCOUNTER — Encounter: Payer: Self-pay | Admitting: "Endocrinology

## 2015-05-03 ENCOUNTER — Ambulatory Visit (INDEPENDENT_AMBULATORY_CARE_PROVIDER_SITE_OTHER): Payer: PRIVATE HEALTH INSURANCE | Admitting: "Endocrinology

## 2015-05-03 VITALS — BP 151/76 | HR 86 | Ht 65.0 in | Wt 168.0 lb

## 2015-05-03 DIAGNOSIS — I10 Essential (primary) hypertension: Secondary | ICD-10-CM | POA: Diagnosis not present

## 2015-05-03 DIAGNOSIS — E1122 Type 2 diabetes mellitus with diabetic chronic kidney disease: Secondary | ICD-10-CM | POA: Diagnosis not present

## 2015-05-03 DIAGNOSIS — N186 End stage renal disease: Secondary | ICD-10-CM

## 2015-05-03 DIAGNOSIS — E785 Hyperlipidemia, unspecified: Secondary | ICD-10-CM

## 2015-05-03 MED ORDER — INSULIN LISPRO 100 UNIT/ML (KWIKPEN): 10.0000 [IU] | PEN_INJECTOR | Freq: Three times a day (TID) | SUBCUTANEOUS | Status: DC

## 2015-05-03 NOTE — Progress Notes (Signed)
Subjective:    Patient ID: [name], female    DOB: [birth year],    Past Medical History  Diagnosis Date  . Diabetes mellitus, type II ([place])   . Hypertension   . Hyperlipidemia   . Stroke ([place])   . Kidney disease    Past Surgical History  Procedure Laterality Date  . Cesarean section    . Abdominal hysterectomy    . Dialysis fistula creation     Social History   Social History  . Marital Status: Married    Spouse Name: N/A  . Number of Children: N/A  . Years of Education: N/A   Social History Main Topics  . Smoking status: Never Smoker   . Smokeless tobacco: None  . Alcohol Use: No  . Drug Use: No  . Sexual Activity: Not Asked   Other Topics Concern  . None   Social History Narrative   Outpatient Encounter Prescriptions as of 05/03/2015  Medication Sig  . aspirin EC 81 MG tablet Take 81 mg by mouth daily.  Marland Kitchen atorvastatin (LIPITOR) 40 MG tablet Take 40 mg by mouth daily.  . calcium acetate (PHOSLO) 667 MG capsule Take by mouth 3 (three) times daily with meals.  . carvedilol (COREG) 12.5 MG tablet Take 12.5 mg by mouth 2 (two) times daily with a meal.  . ergocalciferol (VITAMIN D2) 50000 UNITS capsule Take 50,000 Units by mouth once a week.  . furosemide (LASIX) 40 MG tablet Take 40 mg by mouth 2 (two) times daily.  . Insulin Detemir (LEVEMIR FLEXPEN) 100 UNIT/ML Pen Inject 60 Units into the skin at bedtime.  . insulin lispro (HUMALOG KWIKPEN) 100 UNIT/ML KiwkPen Inject 0.1-0.16 mLs (10-16 Units total) into the skin 3 (three) times daily.  Marland Kitchen lisinopril (PRINIVIL,ZESTRIL) 40 MG tablet Take 40 mg by mouth daily.  . multivitamin (RENA-VIT) TABS tablet Take 1 tablet by mouth daily.  . vitamin B-12 (CYANOCOBALAMIN) 500 MCG tablet Take 500 mcg by mouth daily.  . [DISCONTINUED] insulin aspart (NOVOLOG FLEXPEN) 100 UNIT/ML FlexPen Inject 12-18 Units into the skin 3 (three) times daily with meals.  . [DISCONTINUED] insulin lispro (HUMALOG KWIKPEN) 100 UNIT/ML KiwkPen  Inject 0.12-0.18 mLs (12-18 Units total) into the skin 3 (three) times daily.   No facility-administered encounter medications on file as of 05/03/2015.   ALLERGIES: No Known Allergies VACCINATION STATUS:  There is no immunization history on file for this patient.  Diabetes She presents for her follow-up diabetic visit. She has type 2 diabetes mellitus. Onset time: She was diagnosed at approximate age of 32 years preceded by history of gestational diabetes in 52. Her disease course has been stable. There are no hypoglycemic associated symptoms. Pertinent negatives for hypoglycemia include no confusion, headaches, pallor or seizures. (She had one episode of hypoglycemia to 40 associated with symptoms including confusion.) Associated symptoms include fatigue and polydipsia. Pertinent negatives for diabetes include no chest pain and no polyphagia. There are no hypoglycemic complications. Symptoms are stable. Diabetic complications include a CVA, nephropathy and retinopathy. Risk factors for coronary artery disease include dyslipidemia, diabetes mellitus, hypertension and sedentary lifestyle. Current diabetic treatment includes insulin injections. She is compliant with treatment some of the time. Her weight is stable. She is following a generally unhealthy diet. She has had a previous visit with a dietitian. She rarely participates in exercise. Home blood sugar record trend: she did not monitor enough. Her overall blood glucose range is 180-200 mg/dl. An ACE inhibitor/angiotensin II receptor blocker is being taken.  Eye exam is current.  Hypertension This is a chronic problem. The current episode started more than 1 year ago. The problem is uncontrolled (Patient states she did not take her blood pressure medications this morning.). Pertinent negatives include no chest pain, headaches, palpitations or shortness of breath. Risk factors for coronary artery disease include dyslipidemia, diabetes mellitus and  sedentary lifestyle. Past treatments include beta blockers, diuretics and ACE inhibitors. Hypertensive end-organ damage includes CVA and retinopathy.  Hyperlipidemia This is a chronic problem. The current episode started more than 1 year ago. The problem is uncontrolled. Pertinent negatives include no chest pain, myalgias or shortness of breath. Current antihyperlipidemic treatment includes statins. Risk factors for coronary artery disease include diabetes mellitus, dyslipidemia, hypertension and a sedentary lifestyle.     Review of Systems  Constitutional: Positive for fatigue. Negative for unexpected weight change.  HENT: Negative for trouble swallowing and voice change.   Eyes: Positive for visual disturbance.  Respiratory: Negative for cough, shortness of breath and wheezing.   Cardiovascular: Negative for chest pain, palpitations and leg swelling.  Gastrointestinal: Negative for nausea, vomiting and diarrhea.  Endocrine: Positive for polydipsia. Negative for cold intolerance, heat intolerance and polyphagia.  Musculoskeletal: Negative for myalgias and arthralgias.  Skin: Negative for color change, pallor, rash and wound.  Neurological: Negative for seizures and headaches.  Psychiatric/Behavioral: Negative for suicidal ideas and confusion.    Objective:    BP 151/76 mmHg  Pulse 86  Ht 5\' 5"  (1.651 m)  Wt 168 lb (76.204 kg)  BMI 27.96 kg/m2  SpO2 95%  Wt Readings from Last 3 Encounters:  05/03/15 168 lb (76.204 kg)  03/20/15 171 lb (77.565 kg)  02/13/15 167 lb (75.751 kg)    Physical Exam  Constitutional: She is oriented to person, place, and time. She appears well-developed.  HENT:  Head: Normocephalic and atraumatic.  Eyes: EOM are normal.  Neck: Normal range of motion. Neck supple. No tracheal deviation present. No thyromegaly present.  Cardiovascular: Normal rate and regular rhythm.   Pulmonary/Chest: Effort normal and breath sounds normal.  Abdominal: Soft. Bowel  sounds are normal. There is no tenderness. There is no guarding.  Musculoskeletal: Normal range of motion. She exhibits no edema.  Neurological: She is alert and oriented to person, place, and time. She has normal reflexes. No cranial nerve deficit. Coordination normal.  Skin: Skin is warm and dry. No rash noted. No erythema. No pallor.  Psychiatric: She has a normal mood and affect. Judgment normal.    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 -She came with better but still significantly fluctuating blood glucose profile. -She has not been consistent on timing of insulin and meals. - Patient has currently uncontrolled symptomatic type 2 DM since  60 years of age,  with most recent A1c  Improved to 8.2% from  12.6 %.    Her diabetes is complicated by end-stage renal disease on dialysis , retinopathy, and cerebrovascular accident and patient remains at a high risk for more acute and chronic complications of diabetes which include CAD, CVA, CKD, retinopathy, and neuropathy. These are all discussed in detail with the patient.  - I have counseled the patient on diet management and weight loss, by adopting a carbohydrate restricted/protein rich diet.  - Suggestion is made for patient to avoid simple carbohydrates   from their diet including Cakes , Desserts, Ice Cream,  Soda (  diet and regular) , Sweet Tea , Candies,  Chips, Cookies, Artificial Sweeteners,   and "Sugar-free" Products . This will help patient to have stable blood glucose profile and potentially avoid unintended weight gain.  - I encouraged the patient to switch to  unprocessed or minimally processed complex starch and increased protein intake (animal or plant source), fruits, and vegetables.  - Patient is advised to stick to a routine mealtimes to eat 3 meals  a day and avoid unnecessary snacks  ( to snack only to correct hypoglycemia).  - The patient will be scheduled with Jearld Fenton, RDN, CDE for individualized DM education.  - I have approached patient with the following individualized plan to manage diabetes and patient agrees:   - I  will continue with Levemir 60 units QHS, and lower  NovoLog  10 units TIDAC for pre-meal BG readings of 90-150mg /dl, plus patient specific correction dose for unexpected hyperglycemia above 150mg /dl, associated with strict monitoring of glucose  AC and HS. - Patient is warned not to take insulin without proper monitoring per orders. -Adjustment parameters are given for hypo and hyperglycemia in writing. -Patient is encouraged to call clinic for blood glucose levels less than 70 or above 300 mg /dl.  -Patient is not a candidate for metformin andSGLT2 inhibitors due to CKD.  - Patient will be considered for incretin therapy as appropriate next visit. - Patient specific target  A1c;  LDL, HDL, Triglycerides, and  Waist Circumference were discussed in detail.  2) BP/HTN: Uncontrolled due to the fact that she did not take her blood pressure medications this morning. I advised her to continue carvedilol 12.5 mg by mouth twice a day lisinopril 10 mg by mouth daily. 3) Lipids/HPL:  Controlled unknown , continue statins. 4)  Weight/Diet: CDE Consult will be initiated , exercise, and detailed carbohydrates information provided.  5) Chronic Care/Health Maintenance:  -Patient is on ACEI/ARB and Statin medications and encouraged to continue to follow up with Ophthalmology, Podiatrist at least yearly or according to recommendations, and advised to   stay away from smoking. I have recommended yearly flu vaccine and pneumonia vaccination at least every 5 years; moderate intensity exercise for up to 150 minutes weekly; and  sleep for at least 7 hours a day.   Patient to bring meter and  blood glucose logs during their next visit.   I advised patient to  maintain close follow up with their PCP for primary care needs. Follow up plan: Return in about 3 months (around 08/03/2015) for diabetes, high blood pressure, high cholesterol, follow up with pre-visit labs, meter, and logs.  Glade Lloyd, MD Phone: [phone]  Fax: [phone]   05/03/2015, 8:32 PM

## 2015-05-03 NOTE — Patient Instructions (Signed)

## 2015-05-14 ENCOUNTER — Other Ambulatory Visit: Payer: Self-pay | Admitting: "Endocrinology

## 2015-07-29 ENCOUNTER — Other Ambulatory Visit: Payer: Self-pay | Admitting: "Endocrinology

## 2015-07-30 ENCOUNTER — Other Ambulatory Visit: Payer: Self-pay | Admitting: "Endocrinology

## 2015-07-30 LAB — BASIC METABOLIC PANEL
BUN: 53 mg/dL — ABNORMAL HIGH (ref 7–25)
CALCIUM: 10 mg/dL (ref 8.6–10.4)
CO2: 26 mmol/L (ref 20–31)
CREATININE: 8.1 mg/dL — AB (ref 0.50–0.99)
Chloride: 99 mmol/L (ref 98–110)
Glucose, Bld: 144 mg/dL — ABNORMAL HIGH (ref 65–99)
Potassium: 4.4 mmol/L (ref 3.5–5.3)
SODIUM: 136 mmol/L (ref 135–146)

## 2015-07-30 LAB — HEMOGLOBIN A1C
HEMOGLOBIN A1C: 10.4 % — AB (ref <5.7)
MEAN PLASMA GLUCOSE: 252 mg/dL

## 2015-08-05 ENCOUNTER — Encounter: Payer: Self-pay | Admitting: "Endocrinology

## 2015-08-05 ENCOUNTER — Ambulatory Visit (INDEPENDENT_AMBULATORY_CARE_PROVIDER_SITE_OTHER): Payer: Medicare Other | Admitting: "Endocrinology

## 2015-08-05 VITALS — BP 140/74 | HR 85 | Ht 65.0 in | Wt 169.0 lb

## 2015-08-05 DIAGNOSIS — N186 End stage renal disease: Secondary | ICD-10-CM | POA: Diagnosis not present

## 2015-08-05 DIAGNOSIS — E1122 Type 2 diabetes mellitus with diabetic chronic kidney disease: Secondary | ICD-10-CM | POA: Diagnosis not present

## 2015-08-05 DIAGNOSIS — E785 Hyperlipidemia, unspecified: Secondary | ICD-10-CM

## 2015-08-05 DIAGNOSIS — I10 Essential (primary) hypertension: Secondary | ICD-10-CM | POA: Diagnosis not present

## 2015-08-05 NOTE — Patient Instructions (Signed)

## 2015-08-05 NOTE — Progress Notes (Signed)
Subjective:    Patient ID: Selena Rowe, female    DOB: Sep 07, 1955,    Past Medical History  Diagnosis Date  . Diabetes mellitus, type II ([place])   . Hypertension   . Hyperlipidemia   . Stroke ([place])   . Kidney disease    Past Surgical History  Procedure Laterality Date  . Cesarean section    . Abdominal hysterectomy    . Dialysis fistula creation     Social History   Social History  . Marital Status: Married    Spouse Name: N/A  . Number of Children: N/A  . Years of Education: N/A   Social History Main Topics  . Smoking status: Never Smoker   . Smokeless tobacco: None  . Alcohol Use: No  . Drug Use: No  . Sexual Activity: Not Asked   Other Topics Concern  . None   Social History Narrative   Outpatient Encounter Prescriptions as of 08/05/2015  Medication Sig  . calcitRIOL (ROCALTROL) 0.25 MCG capsule Take 0.25 mcg by mouth as directed.  . doxazosin (CARDURA) 1 MG tablet Take 1 mg by mouth daily.  . sevelamer carbonate (RENVELA) 800 MG tablet Take 800 mg by mouth 3 (three) times daily with meals.  Marland Kitchen aspirin EC 81 MG tablet Take 81 mg by mouth daily.  Marland Kitchen atorvastatin (LIPITOR) 40 MG tablet Take 40 mg by mouth daily.  . calcium acetate (PHOSLO) 667 MG capsule Take by mouth 3 (three) times daily with meals.  . carvedilol (COREG) 12.5 MG tablet Take 12.5 mg by mouth 2 (two) times daily with a meal.  . ergocalciferol (VITAMIN D2) 50000 UNITS capsule Take 50,000 Units by mouth once a week.  . furosemide (LASIX) 40 MG tablet Take 40 mg by mouth 2 (two) times daily.  . insulin lispro (HUMALOG KWIKPEN) 100 UNIT/ML KiwkPen Inject 0.1-0.16 mLs (10-16 Units total) into the skin 3 (three) times daily.  Marland Kitchen LEVEMIR FLEXTOUCH 100 UNIT/ML Pen INJECT 60 UNITS SUBCUTANEOUSLY INTO THE SKIN ONCE DAILY AT BEDTIME.  Marland Kitchen lisinopril (PRINIVIL,ZESTRIL) 40 MG tablet Take 40 mg by mouth daily.  . multivitamin (RENA-VIT) TABS tablet Take 1 tablet by mouth daily.  . vitamin B-12 (CYANOCOBALAMIN)  500 MCG tablet Take 500 mcg by mouth daily.   No facility-administered encounter medications on file as of 08/05/2015.   ALLERGIES: No Known Allergies VACCINATION STATUS:  There is no immunization history on file for this patient.  Diabetes She presents for her follow-up diabetic visit. She has type 2 diabetes mellitus. Onset time: She was diagnosed at approximate age of 82 years preceded by history of gestational diabetes in 71. Her disease course has been worsening. There are no hypoglycemic associated symptoms. Pertinent negatives for hypoglycemia include no confusion, headaches, pallor or seizures. (She had one episode of hypoglycemia to 40 associated with symptoms including confusion.) Associated symptoms include fatigue and polydipsia. Pertinent negatives for diabetes include no chest pain and no polyphagia. There are no hypoglycemic complications. Symptoms are worsening. Diabetic complications include a CVA, nephropathy and retinopathy. Risk factors for coronary artery disease include dyslipidemia, diabetes mellitus, hypertension and sedentary lifestyle. Current diabetic treatment includes insulin injections. She is compliant with treatment some of the time. Her weight is stable. She is following a generally unhealthy diet. She has had a previous visit with a dietitian. She rarely participates in exercise. Home blood sugar record trend: she did not monitor enough. Her overall blood glucose range is >200 mg/dl. An ACE inhibitor/angiotensin II receptor blocker is  being taken. Eye exam is current.  Hypertension This is a chronic problem. The current episode started more than 1 year ago. The problem is uncontrolled (Patient states she did not take her blood pressure medications this morning.). Pertinent negatives include no chest pain, headaches, palpitations or shortness of breath. Risk factors for coronary artery disease include dyslipidemia, diabetes mellitus and sedentary lifestyle. Past  treatments include beta blockers, diuretics and ACE inhibitors. Hypertensive end-organ damage includes CVA and retinopathy.  Hyperlipidemia This is a chronic problem. The current episode started more than 1 year ago. The problem is uncontrolled. Pertinent negatives include no chest pain, myalgias or shortness of breath. Current antihyperlipidemic treatment includes statins. Risk factors for coronary artery disease include diabetes mellitus, dyslipidemia, hypertension and a sedentary lifestyle.     Review of Systems  Constitutional: Positive for fatigue. Negative for unexpected weight change.  HENT: Negative for trouble swallowing and voice change.   Eyes: Positive for visual disturbance.  Respiratory: Negative for cough, shortness of breath and wheezing.   Cardiovascular: Negative for chest pain, palpitations and leg swelling.  Gastrointestinal: Negative for nausea, vomiting and diarrhea.  Endocrine: Positive for polydipsia. Negative for cold intolerance, heat intolerance and polyphagia.  Musculoskeletal: Negative for myalgias and arthralgias.  Skin: Negative for color change, pallor, rash and wound.  Neurological: Negative for seizures and headaches.  Psychiatric/Behavioral: Negative for suicidal ideas and confusion.    Objective:    BP 140/74 mmHg  Pulse 85  Ht 5\' 5"  (1.651 m)  Wt 169 lb (76.658 kg)  BMI 28.12 kg/m2  Wt Readings from Last 3 Encounters:  08/05/15 169 lb (76.658 kg)  05/03/15 168 lb (76.204 kg)  03/20/15 171 lb (77.565 kg)    Physical Exam  Constitutional: She is oriented to person, place, and time. She appears well-developed.  HENT:  Head: Normocephalic and atraumatic.  Eyes: EOM are normal.  Neck: Normal range of motion. Neck supple. No tracheal deviation present. No thyromegaly present.  Cardiovascular: Normal rate and regular rhythm.   Pulmonary/Chest: Effort normal and breath sounds normal.  Abdominal: Soft. Bowel sounds are normal. There is no  tenderness. There is no guarding.  Musculoskeletal: Normal range of motion. She exhibits no edema.  Neurological: She is alert and oriented to person, place, and time. She has normal reflexes. No cranial nerve deficit. Coordination normal.  Skin: Skin is warm and dry. No rash noted. No erythema. No pallor.  Psychiatric: She has a normal mood and affect. Judgment normal.    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 -She came with better but still significantly fluctuating blood glucose profile. -She has not been consistent on timing of insulin and meals. - Patient has currently uncontrolled symptomatic type 2 DM since  60 years of age,  with most recent A1c  Is reversing to 10.4% from 8.2%, after generally improving from 12.6 %.  -This is mainly due to her nonadherence to prescribed therapy.   Her diabetes is complicated by end-stage renal disease on dialysis , retinopathy, and cerebrovascular accident and patient remains at a high risk for more acute and chronic complications of diabetes which include CAD, CVA, CKD, retinopathy, and neuropathy. These are all discussed in detail with the patient.  - I have counseled the patient on diet management and weight loss, by adopting a carbohydrate restricted/protein rich diet.  - Suggestion is made for patient to avoid  simple carbohydrates   from their diet including Cakes , Desserts, Ice Cream,  Soda (  diet and regular) , Sweet Tea , Candies,  Chips, Cookies, Artificial Sweeteners,   and "Sugar-free" Products . This will help patient to have stable blood glucose profile and potentially avoid unintended weight gain.  - I encouraged the patient to switch to  unprocessed or minimally processed complex starch and increased protein intake (animal or plant source), fruits, and vegetables.  - Patient is advised to stick  to a routine mealtimes to eat 3 meals  a day and avoid unnecessary snacks ( to snack only to correct hypoglycemia).  - The patient will be scheduled with Jearld Fenton, RDN, CDE for individualized DM education.  - I have approached patient with the following individualized plan to manage diabetes and patient agrees:   - Emphasizing the need for strict compliance, I  will continue with Levemir 60 units QHS, and Humalog   10 units TIDAC for pre-meal BG readings of 90-150mg /dl, plus patient specific correction dose for unexpected hyperglycemia above 150mg /dl, associated with strict monitoring of glucose  AC and HS. - Patient is warned not to take insulin without proper monitoring per orders. -Adjustment parameters are given for hypo and hyperglycemia in writing. -Patient is encouraged to call clinic for blood glucose levels less than 70 or above 300 mg /dl. -If she cannot afford insulin analogs, she will be considered for premixed Novolin 70/30 to use twice a day.   -Patient is not a candidate for metformin andSGLT2 inhibitors due to CKD.  - Patient will be considered for incretin therapy as appropriate next visit. - Patient specific target  A1c;  LDL, HDL, Triglycerides, and  Waist Circumference were discussed in detail.  2) BP/HTN: Uncontrolled due to the fact that she did not take her blood pressure medications this morning. I advised her to continue carvedilol 12.5 mg by mouth twice a day lisinopril 10 mg by mouth daily. 3) Lipids/HPL:  Controlled unknown , continue statins. 4)  Weight/Diet: CDE Consult will be initiated , exercise, and detailed carbohydrates information provided.  5) Chronic Care/Health Maintenance:  -Patient is on ACEI/ARB and Statin medications and encouraged to continue to follow up with Ophthalmology, Podiatrist at least yearly or according to recommendations, and advised to   stay away from smoking. I have recommended yearly flu vaccine and pneumonia vaccination at  least every 5 years; moderate intensity exercise for up to 150 minutes weekly; and  sleep for at least 7 hours a day.   Patient to bring meter and  blood glucose logs during their next visit.   I advised patient to maintain close follow up with their PCP for primary care needs. Follow up plan: Return in about 1 week (around 08/12/2015) for diabetes, high blood pressure, high cholesterol, follow up with meter and logs- no labs.  Glade Lloyd, MD Phone: [phone]  Fax: [phone]   08/05/2015, 12:03 PM

## 2015-08-13 ENCOUNTER — Encounter: Payer: Self-pay | Admitting: "Endocrinology

## 2015-08-13 ENCOUNTER — Ambulatory Visit (INDEPENDENT_AMBULATORY_CARE_PROVIDER_SITE_OTHER): Payer: PRIVATE HEALTH INSURANCE | Admitting: "Endocrinology

## 2015-08-13 VITALS — BP 142/82 | HR 90 | Ht 65.0 in | Wt 170.0 lb

## 2015-08-13 DIAGNOSIS — E785 Hyperlipidemia, unspecified: Secondary | ICD-10-CM

## 2015-08-13 DIAGNOSIS — I10 Essential (primary) hypertension: Secondary | ICD-10-CM | POA: Diagnosis not present

## 2015-08-13 DIAGNOSIS — E1122 Type 2 diabetes mellitus with diabetic chronic kidney disease: Secondary | ICD-10-CM

## 2015-08-13 DIAGNOSIS — N186 End stage renal disease: Secondary | ICD-10-CM

## 2015-08-13 NOTE — Progress Notes (Signed)
Subjective:    Patient ID: Selena Rowe, female    DOB: 09/14/1955,    Past Medical History  Diagnosis Date  . Diabetes mellitus, type II ([place])   . Hypertension   . Hyperlipidemia   . Stroke ([place])   . Kidney disease    Past Surgical History  Procedure Laterality Date  . Cesarean section    . Abdominal hysterectomy    . Dialysis fistula creation     Social History   Social History  . Marital Status: Married    Spouse Name: N/A  . Number of Children: N/A  . Years of Education: N/A   Social History Main Topics  . Smoking status: Never Smoker   . Smokeless tobacco: None  . Alcohol Use: No  . Drug Use: No  . Sexual Activity: Not Asked   Other Topics Concern  . None   Social History Narrative   Outpatient Encounter Prescriptions as of 08/13/2015  Medication Sig  . Insulin Lispro (HUMALOG KWIKPEN [place]) Inject 15-21 Units into the skin 3 (three) times daily with meals.  Marland Kitchen aspirin EC 81 MG tablet Take 81 mg by mouth daily.  Marland Kitchen atorvastatin (LIPITOR) 40 MG tablet Take 40 mg by mouth daily.  . calcitRIOL (ROCALTROL) 0.25 MCG capsule Take 0.25 mcg by mouth as directed.  . calcium acetate (PHOSLO) 667 MG capsule Take by mouth 3 (three) times daily with meals.  . carvedilol (COREG) 12.5 MG tablet Take 12.5 mg by mouth 2 (two) times daily with a meal.  . doxazosin (CARDURA) 1 MG tablet Take 1 mg by mouth daily.  . ergocalciferol (VITAMIN D2) 50000 UNITS capsule Take 50,000 Units by mouth once a week.  . furosemide (LASIX) 40 MG tablet Take 40 mg by mouth 2 (two) times daily.  Marland Kitchen LEVEMIR FLEXTOUCH 100 UNIT/ML Pen INJECT 60 UNITS SUBCUTANEOUSLY INTO THE SKIN ONCE DAILY AT BEDTIME.  Marland Kitchen lisinopril (PRINIVIL,ZESTRIL) 40 MG tablet Take 40 mg by mouth daily.  . multivitamin (RENA-VIT) TABS tablet Take 1 tablet by mouth daily.  . sevelamer carbonate (RENVELA) 800 MG tablet Take 800 mg by mouth 3 (three) times daily with meals.  . vitamin B-12 (CYANOCOBALAMIN) 500 MCG tablet Take 500 mcg  by mouth daily.  . [DISCONTINUED] insulin lispro (HUMALOG KWIKPEN) 100 UNIT/ML KiwkPen Inject 0.1-0.16 mLs (10-16 Units total) into the skin 3 (three) times daily.   No facility-administered encounter medications on file as of 08/13/2015.   ALLERGIES: No Known Allergies VACCINATION STATUS:  There is no immunization history on file for this patient.  Diabetes She presents for her follow-up diabetic visit. She has type 2 diabetes mellitus. Onset time: She was diagnosed at approximate age of 60 years preceded by history of gestational diabetes in 25. Her disease course has been improving. There are no hypoglycemic associated symptoms. Pertinent negatives for hypoglycemia include no confusion, headaches, pallor or seizures. (She had one episode of hypoglycemia to 40 associated with symptoms including confusion.) Associated symptoms include fatigue and polydipsia. Pertinent negatives for diabetes include no chest pain and no polyphagia. There are no hypoglycemic complications. Symptoms are improving. Diabetic complications include a CVA, nephropathy and retinopathy. Risk factors for coronary artery disease include dyslipidemia, diabetes mellitus, hypertension and sedentary lifestyle. Current diabetic treatment includes insulin injections. She is compliant with treatment some of the time. Her weight is stable. She is following a generally unhealthy diet. She has had a previous visit with a dietitian. She rarely participates in exercise. Home blood sugar record trend:  she did not monitor enough. Her breakfast blood glucose range is generally 180-200 mg/dl. Her lunch blood glucose range is generally 180-200 mg/dl. Her dinner blood glucose range is generally 180-200 mg/dl. Her overall blood glucose range is 180-200 mg/dl. An ACE inhibitor/angiotensin II receptor blocker is being taken. Eye exam is current.  Hypertension This is a chronic problem. The current episode started more than 1 year ago. The problem is  uncontrolled (Patient states she did not take her blood pressure medications this morning.). Pertinent negatives include no chest pain, headaches, palpitations or shortness of breath. Risk factors for coronary artery disease include dyslipidemia, diabetes mellitus and sedentary lifestyle. Past treatments include beta blockers, diuretics and ACE inhibitors. Hypertensive end-organ damage includes CVA and retinopathy.  Hyperlipidemia This is a chronic problem. The current episode started more than 1 year ago. The problem is uncontrolled. Pertinent negatives include no chest pain, myalgias or shortness of breath. Current antihyperlipidemic treatment includes statins. Risk factors for coronary artery disease include diabetes mellitus, dyslipidemia, hypertension and a sedentary lifestyle.     Review of Systems  Constitutional: Positive for fatigue. Negative for unexpected weight change.  HENT: Negative for trouble swallowing and voice change.   Eyes: Positive for visual disturbance.  Respiratory: Negative for cough, shortness of breath and wheezing.   Cardiovascular: Negative for chest pain, palpitations and leg swelling.  Gastrointestinal: Negative for nausea, vomiting and diarrhea.  Endocrine: Positive for polydipsia. Negative for cold intolerance, heat intolerance and polyphagia.  Musculoskeletal: Negative for myalgias and arthralgias.  Skin: Negative for color change, pallor, rash and wound.  Neurological: Negative for seizures and headaches.  Psychiatric/Behavioral: Negative for suicidal ideas and confusion.    Objective:    BP 142/82 mmHg  Pulse 90  Ht 5\' 5"  (1.651 m)  Wt 170 lb (77.111 kg)  BMI 28.29 kg/m2  Wt Readings from Last 3 Encounters:  08/13/15 170 lb (77.111 kg)  08/05/15 169 lb (76.658 kg)  05/03/15 168 lb (76.204 kg)    Physical Exam  Constitutional: She is oriented to person, place, and time. She appears well-developed.  HENT:  Head: Normocephalic and atraumatic.   Eyes: EOM are normal.  Neck: Normal range of motion. Neck supple. No tracheal deviation present. No thyromegaly present.  Cardiovascular: Normal rate and regular rhythm.   Pulmonary/Chest: Effort normal and breath sounds normal.  Abdominal: Soft. Bowel sounds are normal. There is no tenderness. There is no guarding.  Musculoskeletal: Normal range of motion. She exhibits no edema.  Neurological: She is alert and oriented to person, place, and time. She has normal reflexes. No cranial nerve deficit. Coordination normal.  Skin: Skin is warm and dry. No rash noted. No erythema. No pallor.  Psychiatric: She has a normal mood and affect. Judgment normal.    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 -She came with better but still significantly fluctuating blood glucose profile. -She has not been consistent on timing of insulin and meals. - Patient has currently uncontrolled symptomatic type 2 DM since  60 years of age,  with most recent A1c  Is reversing to 10.4% from 8.2%, after generally improving from 12.6 %.  -This is mainly due to her nonadherence to prescribed therapy.   Her diabetes is complicated by end-stage renal disease on dialysis , retinopathy, and cerebrovascular accident and patient remains at a high risk for more acute and chronic complications of  diabetes which include CAD, CVA, CKD, retinopathy, and neuropathy. These are all discussed in detail with the patient.  - I have counseled the patient on diet management and weight loss, by adopting a carbohydrate restricted/protein rich diet.  - Suggestion is made for patient to avoid simple carbohydrates   from their diet including Cakes , Desserts, Ice Cream,  Soda (  diet and regular) , Sweet Tea , Candies,  Chips, Cookies, Artificial Sweeteners,   and "Sugar-free" Products . This will help patient  to have stable blood glucose profile and potentially avoid unintended weight gain.  - I encouraged the patient to switch to  unprocessed or minimally processed complex starch and increased protein intake (animal or plant source), fruits, and vegetables.  - Patient is advised to stick to a routine mealtimes to eat 3 meals  a day and avoid unnecessary snacks ( to snack only to correct hypoglycemia).  - The patient will be scheduled with Jearld Fenton, RDN, CDE for individualized DM education.  - I have approached patient with the following individualized plan to manage diabetes and patient agrees:   - Emphasizing the need for strict compliance, I  will continue with Levemir 60 units QHS, and increase Humalog to 15 units TIDAC for pre-meal BG readings of 90-150mg /dl, plus patient specific correction dose for unexpected hyperglycemia above 150mg /dl, associated with strict monitoring of glucose  AC and HS. - Patient is warned not to take insulin without proper monitoring per orders. -Adjustment parameters are given for hypo and hyperglycemia in writing. -Patient is encouraged to call clinic for blood glucose levels less than 70 or above 300 mg /dl. -If she cannot afford insulin analogs, she will be considered for premixed Novolin 70/30 to use twice a day.   -Patient is not a candidate for metformin andSGLT2 inhibitors due to CKD.  - Patient will be considered for incretin therapy as appropriate next visit. - Patient specific target  A1c;  LDL, HDL, Triglycerides, and  Waist Circumference were discussed in detail.  2) BP/HTN: Uncontrolled due to the fact that she did not take her blood pressure medications this morning. I advised her to continue carvedilol 12.5 mg by mouth twice a day lisinopril 10 mg by mouth daily. 3) Lipids/HPL:  Controlled unknown , continue statins. 4)  Weight/Diet: CDE Consult will be initiated , exercise, and detailed carbohydrates information provided.  5) Chronic  Care/Health Maintenance:  -Patient is on ACEI/ARB and Statin medications and encouraged to continue to follow up with Ophthalmology, Podiatrist at least yearly or according to recommendations, and advised to   stay away from smoking. I have recommended yearly flu vaccine and pneumonia vaccination at least every 5 years; moderate intensity exercise for up to 150 minutes weekly; and  sleep for at least 7 hours a day.   Patient to bring meter and  blood glucose logs during their next visit.   I advised patient to maintain close follow up with their PCP for primary care needs. Follow up plan: Return in about 10 weeks (around 10/22/2015) for diabetes, high blood pressure, high cholesterol, follow up with pre-visit labs, meter, and logs.  Glade Lloyd, MD Phone: [phone]  Fax: [phone]   08/13/2015, 10:47 AM

## 2015-08-13 NOTE — Patient Instructions (Signed)

## 2015-10-07 ENCOUNTER — Other Ambulatory Visit: Payer: Self-pay | Admitting: "Endocrinology

## 2015-10-16 ENCOUNTER — Other Ambulatory Visit: Payer: Self-pay | Admitting: "Endocrinology

## 2015-10-16 LAB — COMPLETE METABOLIC PANEL WITH GFR
ALBUMIN: 3.3 g/dL — AB (ref 3.6–5.1)
ALK PHOS: 99 U/L (ref 33–130)
ALT: 18 U/L (ref 6–29)
AST: 19 U/L (ref 10–35)
BILIRUBIN TOTAL: 0.3 mg/dL (ref 0.2–1.2)
BUN: 40 mg/dL — AB (ref 7–25)
CALCIUM: 9.1 mg/dL (ref 8.6–10.4)
CO2: 22 mmol/L (ref 20–31)
CREATININE: 7.72 mg/dL — AB (ref 0.50–0.99)
Chloride: 104 mmol/L (ref 98–110)
GFR, Est African American: 6 mL/min — ABNORMAL LOW (ref >=60)
GFR, Est Non African American: 5 mL/min — ABNORMAL LOW (ref >=60)
Glucose, Bld: 140 mg/dL — ABNORMAL HIGH (ref 65–99)
Potassium: 4.5 mmol/L (ref 3.5–5.3)
Sodium: 139 mmol/L (ref 135–146)
TOTAL PROTEIN: 5.7 g/dL — AB (ref 6.1–8.1)

## 2015-10-16 LAB — LIPID PANEL
CHOLESTEROL: 134 mg/dL (ref 125–200)
HDL: 45 mg/dL — ABNORMAL LOW (ref >=46)
LDL Cholesterol: 55 mg/dL (ref <130)
Total CHOL/HDL Ratio: 3 Ratio (ref <=5.0)
Triglycerides: 168 mg/dL — ABNORMAL HIGH (ref <150)
VLDL: 34 mg/dL — ABNORMAL HIGH (ref <30)

## 2015-10-17 LAB — HEMOGLOBIN A1C
HEMOGLOBIN A1C: 7.1 % — AB (ref <5.7)
MEAN PLASMA GLUCOSE: 157 mg/dL

## 2015-10-23 ENCOUNTER — Ambulatory Visit (INDEPENDENT_AMBULATORY_CARE_PROVIDER_SITE_OTHER): Payer: PRIVATE HEALTH INSURANCE | Admitting: "Endocrinology

## 2015-10-23 ENCOUNTER — Encounter: Payer: Self-pay | Admitting: "Endocrinology

## 2015-10-23 VITALS — BP 162/94 | HR 69 | Ht 65.0 in | Wt 170.0 lb

## 2015-10-23 DIAGNOSIS — E785 Hyperlipidemia, unspecified: Secondary | ICD-10-CM

## 2015-10-23 DIAGNOSIS — N186 End stage renal disease: Secondary | ICD-10-CM | POA: Diagnosis not present

## 2015-10-23 DIAGNOSIS — E1122 Type 2 diabetes mellitus with diabetic chronic kidney disease: Secondary | ICD-10-CM

## 2015-10-23 DIAGNOSIS — I10 Essential (primary) hypertension: Secondary | ICD-10-CM | POA: Diagnosis not present

## 2015-10-23 MED ORDER — INSULIN DETEMIR 100 UNIT/ML FLEXPEN: 50.0000 [IU] | PEN_INJECTOR | Freq: Every day | SUBCUTANEOUS | 2 refills | Status: DC

## 2015-10-23 NOTE — Patient Instructions (Signed)

## 2015-10-23 NOTE — Progress Notes (Signed)
Subjective:    Patient ID: Selena Rowe, female    DOB: Jun 06, 1955,    Past Medical History:  Diagnosis Date  . Diabetes mellitus, type II ([place])   . Hyperlipidemia   . Hypertension   . Kidney disease   . Stroke [facility])    Past Surgical History:  Procedure Laterality Date  . ABDOMINAL HYSTERECTOMY    . CESAREAN SECTION    . DIALYSIS FISTULA CREATION     Social History   Social History  . Marital status: Married    Spouse name: N/A  . Number of children: N/A  . Years of education: N/A   Social History Main Topics  . Smoking status: Never Smoker  . Smokeless tobacco: Never Used  . Alcohol use No  . Drug use: No  . Sexual activity: Not Asked   Other Topics Concern  . None   Social History Narrative  . None   Outpatient Encounter Prescriptions as of 10/23/2015  Medication Sig  . aspirin EC 81 MG tablet Take 81 mg by mouth daily.  Marland Kitchen atorvastatin (LIPITOR) 40 MG tablet Take 40 mg by mouth daily.  . calcitRIOL (ROCALTROL) 0.25 MCG capsule Take 0.25 mcg by mouth as directed.  . calcium acetate (PHOSLO) 667 MG capsule Take by mouth 3 (three) times daily with meals.  . carvedilol (COREG) 12.5 MG tablet Take 12.5 mg by mouth 2 (two) times daily with a meal.  . doxazosin (CARDURA) 1 MG tablet Take 1 mg by mouth daily.  . ergocalciferol (VITAMIN D2) 50000 UNITS capsule Take 50,000 Units by mouth once a week.  . furosemide (LASIX) 40 MG tablet Take 40 mg by mouth 2 (two) times daily.  . Insulin Detemir (LEVEMIR FLEXTOUCH) 100 UNIT/ML Pen Inject 50 Units into the skin daily at 10 pm.  . Insulin Lispro (HUMALOG KWIKPEN [place]) Inject 10-16 Units into the skin 3 (three) times daily with meals.  . multivitamin (RENA-VIT) TABS tablet Take 1 tablet by mouth daily.  . sevelamer carbonate (RENVELA) 800 MG tablet Take 800 mg by mouth 3 (three) times daily with meals.  . vitamin B-12 (CYANOCOBALAMIN) 500 MCG tablet Take 500 mcg by mouth daily.  . [DISCONTINUED] LEVEMIR FLEXTOUCH 100  UNIT/ML Pen INJECT 60 UNITS SUBCUTANEOUSLY INTO THE SKIN ONCE DAILY AT BEDTIME.  . [DISCONTINUED] lisinopril (PRINIVIL,ZESTRIL) 40 MG tablet Take 40 mg by mouth daily.   No facility-administered encounter medications on file as of 10/23/2015.    ALLERGIES: No Known Allergies VACCINATION STATUS:  There is no immunization history on file for this patient.  Diabetes  She presents for her follow-up diabetic visit. She has type 2 diabetes mellitus. Onset time: She was diagnosed at approximate age of 19 years preceded by history of gestational diabetes in 10. Her disease course has been improving. There are no hypoglycemic associated symptoms. Pertinent negatives for hypoglycemia include no confusion, headaches, pallor or seizures. (She had one episode of hypoglycemia to 40 associated with symptoms including confusion.) Associated symptoms include fatigue and polydipsia. Pertinent negatives for diabetes include no chest pain and no polyphagia. There are no hypoglycemic complications. Symptoms are improving. Diabetic complications include a CVA, nephropathy and retinopathy. Risk factors for coronary artery disease include dyslipidemia, diabetes mellitus, hypertension and sedentary lifestyle. Current diabetic treatment includes insulin injections. She is compliant with treatment some of the time. Her weight is stable. She is following a generally unhealthy diet. She has had a previous visit with a dietitian. She rarely participates in exercise. Home blood  sugar record trend: she did not monitor enough. Her breakfast blood glucose range is generally 140-180 mg/dl. Her lunch blood glucose range is generally 140-180 mg/dl. Her dinner blood glucose range is generally 140-180 mg/dl. Her overall blood glucose range is 140-180 mg/dl. An ACE inhibitor/angiotensin II receptor blocker is being taken. Eye exam is current.  Hypertension  This is a chronic problem. The current episode started more than 1 year ago. The  problem is uncontrolled (Patient states she did not take her blood pressure medications this morning.). Pertinent negatives include no chest pain, headaches, palpitations or shortness of breath. Risk factors for coronary artery disease include dyslipidemia, diabetes mellitus and sedentary lifestyle. Past treatments include beta blockers, diuretics and ACE inhibitors. Hypertensive end-organ damage includes CVA and retinopathy.  Hyperlipidemia  This is a chronic problem. The current episode started more than 1 year ago. The problem is uncontrolled. Pertinent negatives include no chest pain, myalgias or shortness of breath. Current antihyperlipidemic treatment includes statins. Risk factors for coronary artery disease include diabetes mellitus, dyslipidemia, hypertension and a sedentary lifestyle.     Review of Systems  Constitutional: Positive for fatigue. Negative for unexpected weight change.  HENT: Negative for trouble swallowing and voice change.   Eyes: Positive for visual disturbance.  Respiratory: Negative for cough, shortness of breath and wheezing.   Cardiovascular: Negative for chest pain, palpitations and leg swelling.  Gastrointestinal: Negative for diarrhea, nausea and vomiting.  Endocrine: Positive for polydipsia. Negative for cold intolerance, heat intolerance and polyphagia.  Musculoskeletal: Negative for arthralgias and myalgias.  Skin: Negative for color change, pallor, rash and wound.  Neurological: Negative for seizures and headaches.  Psychiatric/Behavioral: Negative for confusion and suicidal ideas.    Objective:    BP (!) 162/94   Pulse 69   Ht 5\' 5"  (1.651 m)   Wt 170 lb (77.1 kg)   BMI 28.29 kg/m   Wt Readings from Last 3 Encounters:  10/23/15 170 lb (77.1 kg)  08/13/15 170 lb (77.1 kg)  08/05/15 169 lb (76.7 kg)    Physical Exam  Constitutional: She is oriented to person, place, and time. She appears well-developed.  HENT:  Head: Normocephalic and  atraumatic.  Eyes: EOM are normal.  Neck: Normal range of motion. Neck supple. No tracheal deviation present. No thyromegaly present.  Cardiovascular: Normal rate and regular rhythm.   Pulmonary/Chest: Effort normal and breath sounds normal.  Abdominal: Soft. Bowel sounds are normal. There is no tenderness. There is no guarding.  Musculoskeletal: Normal range of motion. She exhibits no edema.  Neurological: She is alert and oriented to person, place, and time. She has normal reflexes. No cranial nerve deficit. Coordination normal.  Skin: Skin is warm and dry. No rash noted. No erythema. No pallor.  Psychiatric: She has a normal mood and affect. Judgment normal.    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 -She came with better but still significantly fluctuating blood glucose profile. -She has not been consistent on timing of insulin and meals. - Patient has currently uncontrolled symptomatic type 2 DM since  60 years of age,  with most recent A1c  Is Improving to 7.1% from 10.4%. She has generally improved her A1c from 12.6 %.     Her diabetes is complicated by end-stage renal disease on dialysis , retinopathy, and cerebrovascular accident and patient remains at a high risk for more acute  and chronic complications of diabetes which include CAD, CVA, CKD, retinopathy, and neuropathy. These are all discussed in detail with the patient.  - I have counseled the patient on diet management and weight loss, by adopting a carbohydrate restricted/protein rich diet.  - Suggestion is made for patient to avoid simple carbohydrates   from their diet including Cakes , Desserts, Ice Cream,  Soda (  diet and regular) , Sweet Tea , Candies,  Chips, Cookies, Artificial Sweeteners,   and "Sugar-free" Products . This will help patient to have stable blood glucose profile  and potentially avoid unintended weight gain.  - I encouraged the patient to switch to  unprocessed or minimally processed complex starch and increased protein intake (animal or plant source), fruits, and vegetables.  - Patient is advised to stick to a routine mealtimes to eat 3 meals  a day and avoid unnecessary snacks ( to snack only to correct hypoglycemia).  - The patient will be scheduled with Jearld Fenton, RDN, CDE for individualized DM education.  - I have approached patient with the following individualized plan to manage diabetes and patient agrees:   - Emphasizing the need for strict compliance, I  will lower Levemir to 50 units QHS, and lower Humalog to 10 units TIDAC for pre-meal BG readings of 90-150mg /dl, plus patient specific correction dose for unexpected hyperglycemia above 150mg /dl, associated with strict monitoring of glucose  AC and HS. - Patient is warned not to take insulin without proper monitoring per orders. -Adjustment parameters are given for hypo and hyperglycemia in writing. -Patient is encouraged to call clinic for blood glucose levels less than 70 or above 300 mg /dl. -If she cannot afford insulin analogs, she will be considered for premixed Novolin 70/30 to use twice a day.   -Patient is not a candidate for metformin andSGLT2 inhibitors due to CKD.  - Patient will be considered for incretin therapy as appropriate next visit. - Patient specific target  A1c;  LDL, HDL, Triglycerides, and  Waist Circumference were discussed in detail.  2) BP/HTN: Uncontrolled due to the fact that she did not take her blood pressure medications this morning. I advised her to continue carvedilol 12.5 mg by mouth twice a day lisinopril 10 mg by mouth daily. 3) Lipids/HPL:  Controlled , LDL at 55.  continue statins. 4)  Weight/Diet: CDE Consult initiated , exercise, and detailed carbohydrates information provided.  5) Chronic Care/Health Maintenance:  -Patient is on ACEI/ARB and  Statin medications and encouraged to continue to follow up with Ophthalmology, Podiatrist at least yearly or according to recommendations, and advised to   stay away from smoking. I have recommended yearly flu vaccine and pneumonia vaccination at least every 5 years; moderate intensity exercise for up to 150 minutes weekly; and  sleep for at least 7 hours a day.   Patient to bring meter and  blood glucose logs during their next visit.   I advised patient to maintain close follow up with their PCP for primary care needs. Follow up plan: Return in about 3 months (around 01/23/2016) for follow up with pre-visit labs, meter, and logs.  Glade Lloyd, MD Phone: [phone]  Fax: [phone]   10/23/2015, 10:28 AM

## 2016-01-21 ENCOUNTER — Other Ambulatory Visit: Payer: Self-pay | Admitting: "Endocrinology

## 2016-01-22 ENCOUNTER — Other Ambulatory Visit: Payer: Self-pay | Admitting: "Endocrinology

## 2016-01-22 LAB — HEMOGLOBIN A1C
HEMOGLOBIN A1C: 8.2 % — AB (ref <5.7)
MEAN PLASMA GLUCOSE: 189 mg/dL

## 2016-01-23 LAB — COMPLETE METABOLIC PANEL WITH GFR
ALK PHOS: 109 U/L (ref 33–130)
ALT: 14 U/L (ref 6–29)
AST: 19 U/L (ref 10–35)
Albumin: 2.8 g/dL — ABNORMAL LOW (ref 3.6–5.1)
BILIRUBIN TOTAL: 0.2 mg/dL (ref 0.2–1.2)
BUN: 53 mg/dL — ABNORMAL HIGH (ref 7–25)
CALCIUM: 8.7 mg/dL (ref 8.6–10.4)
CO2: 27 mmol/L (ref 20–31)
Chloride: 99 mmol/L (ref 98–110)
Creat: 10.32 mg/dL — ABNORMAL HIGH (ref 0.50–0.99)
GFR, EST NON AFRICAN AMERICAN: 4 mL/min — AB (ref >=60)
Glucose, Bld: 123 mg/dL — ABNORMAL HIGH (ref 65–99)
Potassium: 4.6 mmol/L (ref 3.5–5.3)
Sodium: 139 mmol/L (ref 135–146)
TOTAL PROTEIN: 5.5 g/dL — AB (ref 6.1–8.1)

## 2016-01-29 ENCOUNTER — Ambulatory Visit (INDEPENDENT_AMBULATORY_CARE_PROVIDER_SITE_OTHER): Payer: PRIVATE HEALTH INSURANCE | Admitting: "Endocrinology

## 2016-01-29 ENCOUNTER — Encounter: Payer: Self-pay | Admitting: "Endocrinology

## 2016-01-29 VITALS — BP 135/78 | HR 82 | Ht 65.0 in | Wt 179.0 lb

## 2016-01-29 DIAGNOSIS — E1122 Type 2 diabetes mellitus with diabetic chronic kidney disease: Secondary | ICD-10-CM | POA: Diagnosis not present

## 2016-01-29 DIAGNOSIS — E782 Mixed hyperlipidemia: Secondary | ICD-10-CM

## 2016-01-29 DIAGNOSIS — Z91199 Patient's noncompliance with other medical treatment and regimen due to unspecified reason: Secondary | ICD-10-CM | POA: Insufficient documentation

## 2016-01-29 DIAGNOSIS — N186 End stage renal disease: Secondary | ICD-10-CM | POA: Diagnosis not present

## 2016-01-29 DIAGNOSIS — Z9119 Patient's noncompliance with other medical treatment and regimen: Secondary | ICD-10-CM

## 2016-01-29 DIAGNOSIS — I10 Essential (primary) hypertension: Secondary | ICD-10-CM

## 2016-01-29 NOTE — Progress Notes (Signed)
Subjective:    Patient ID: Selena Rowe, female    DOB: 1955-07-30,    Past Medical History:  Diagnosis Date  . Diabetes mellitus, type II ([place])   . Hyperlipidemia   . Hypertension   . Kidney disease   . Stroke [facility])    Past Surgical History:  Procedure Laterality Date  . ABDOMINAL HYSTERECTOMY    . CESAREAN SECTION    . DIALYSIS FISTULA CREATION     Social History   Social History  . Marital status: Married    Spouse name: N/A  . Number of children: N/A  . Years of education: N/A   Social History Main Topics  . Smoking status: Never Smoker  . Smokeless tobacco: Never Used  . Alcohol use No  . Drug use: No  . Sexual activity: Not on file   Other Topics Concern  . Not on file   Social History Narrative  . No narrative on file   Outpatient Encounter Prescriptions as of 01/29/2016  Medication Sig  . aspirin EC 81 MG tablet Take 81 mg by mouth daily.  Marland Kitchen atorvastatin (LIPITOR) 40 MG tablet Take 40 mg by mouth daily.  Lorin Picket 1 GM 210 MG(Fe) TABS   . calcitRIOL (ROCALTROL) 0.25 MCG capsule Take 0.25 mcg by mouth as directed.  . calcium acetate (PHOSLO) 667 MG capsule Take by mouth 3 (three) times daily with meals.  . carvedilol (COREG) 12.5 MG tablet Take 12.5 mg by mouth 2 (two) times daily with a meal.  . doxazosin (CARDURA) 1 MG tablet Take 1 mg by mouth daily.  . ergocalciferol (VITAMIN D2) 50000 UNITS capsule Take 50,000 Units by mouth once a week.  . furosemide (LASIX) 40 MG tablet Take 40 mg by mouth 2 (two) times daily.  . Insulin Detemir (LEVEMIR FLEXTOUCH) 100 UNIT/ML Pen Inject 50 Units into the skin daily at 10 pm. (Patient taking differently: Inject 60 Units into the skin daily at 10 pm. )  . Insulin Lispro (HUMALOG KWIKPEN [place]) Inject 10-16 Units into the skin 3 (three) times daily with meals.  . multivitamin (RENA-VIT) TABS tablet Take 1 tablet by mouth daily.  . vitamin B-12 (CYANOCOBALAMIN) 500 MCG tablet Take 500 mcg by mouth daily.  .  [DISCONTINUED] LEVEMIR FLEXTOUCH 100 UNIT/ML Pen INJECT 60 UNITS SUBCUTANEOUSLY ONCE DAILY AT BEDTIME.  . [DISCONTINUED] sevelamer carbonate (RENVELA) 800 MG tablet Take 800 mg by mouth 3 (three) times daily with meals.   No facility-administered encounter medications on file as of 01/29/2016.    ALLERGIES: No Known Allergies VACCINATION STATUS:  There is no immunization history on file for this patient.  Diabetes  She presents for her follow-up diabetic visit. She has type 2 diabetes mellitus. Onset time: She was diagnosed at approximate age of 17 years preceded by history of gestational diabetes in 81. Her disease course has been improving. There are no hypoglycemic associated symptoms. Pertinent negatives for hypoglycemia include no confusion, headaches, pallor or seizures. (She had one episode of hypoglycemia to 40 associated with symptoms including confusion.) Associated symptoms include fatigue and polydipsia. Pertinent negatives for diabetes include no chest pain and no polyphagia. There are no hypoglycemic complications. Symptoms are improving. Diabetic complications include a CVA, nephropathy and retinopathy. Risk factors for coronary artery disease include dyslipidemia, diabetes mellitus, hypertension and sedentary lifestyle. Current diabetic treatment includes insulin injections. She is compliant with treatment some of the time. Her weight is stable. She is following a generally unhealthy diet. She has had  a previous visit with a dietitian. She rarely participates in exercise. Home blood sugar record trend: she did not monitor enough. Her breakfast blood glucose range is generally 140-180 mg/dl. Her lunch blood glucose range is generally 140-180 mg/dl. Her dinner blood glucose range is generally 140-180 mg/dl. Her overall blood glucose range is 140-180 mg/dl. An ACE inhibitor/angiotensin II receptor blocker is being taken. Eye exam is current.  Hypertension  This is a chronic problem. The  current episode started more than 1 year ago. The problem is uncontrolled (Patient states she did not take her blood pressure medications this morning.). Pertinent negatives include no chest pain, headaches, palpitations or shortness of breath. Risk factors for coronary artery disease include dyslipidemia, diabetes mellitus and sedentary lifestyle. Past treatments include beta blockers, diuretics and ACE inhibitors. Hypertensive end-organ damage includes CVA and retinopathy.  Hyperlipidemia  This is a chronic problem. The current episode started more than 1 year ago. The problem is uncontrolled. Pertinent negatives include no chest pain, myalgias or shortness of breath. Current antihyperlipidemic treatment includes statins. Risk factors for coronary artery disease include diabetes mellitus, dyslipidemia, hypertension and a sedentary lifestyle.     Review of Systems  Constitutional: Positive for fatigue. Negative for unexpected weight change.  HENT: Negative for trouble swallowing and voice change.   Eyes: Positive for visual disturbance.  Respiratory: Negative for cough, shortness of breath and wheezing.   Cardiovascular: Negative for chest pain, palpitations and leg swelling.  Gastrointestinal: Negative for diarrhea, nausea and vomiting.  Endocrine: Positive for polydipsia. Negative for cold intolerance, heat intolerance and polyphagia.  Musculoskeletal: Negative for arthralgias and myalgias.  Skin: Negative for color change, pallor, rash and wound.  Neurological: Negative for seizures and headaches.  Psychiatric/Behavioral: Negative for confusion and suicidal ideas.    Objective:    BP 135/78   Pulse 82   Ht 5\' 5"  (1.651 m)   Wt 179 lb (81.2 kg)   BMI 29.79 kg/m   Wt Readings from Last 3 Encounters:  01/29/16 179 lb (81.2 kg)  10/23/15 170 lb (77.1 kg)  08/13/15 170 lb (77.1 kg)    Physical Exam  Constitutional: She is oriented to person, place, and time. She appears  well-developed.  HENT:  Head: Normocephalic and atraumatic.  Eyes: EOM are normal.  Neck: Normal range of motion. Neck supple. No tracheal deviation present. No thyromegaly present.  Cardiovascular: Normal rate and regular rhythm.   Pulmonary/Chest: Effort normal and breath sounds normal.  Abdominal: Soft. Bowel sounds are normal. There is no tenderness. There is no guarding.  Musculoskeletal: Normal range of motion. She exhibits no edema.  Neurological: She is alert and oriented to person, place, and time. She has normal reflexes. No cranial nerve deficit. Coordination normal.  Skin: Skin is warm and dry. No rash noted. No erythema. No pallor.  Psychiatric: She has a normal mood and affect. Judgment normal.    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 -She came with Inadequate monitoring and without her logs. -She has not been consistent on timing of insulin and meals. - Patient has currently uncontrolled symptomatic type 2 DM since  60 years of age,  with most recent A1c  increasing to 8.2% from 7.1%, after generally improving from 12.6%.   Her diabetes is complicated by end-stage renal disease on dialysis , retinopathy, and cerebrovascular accident and patient remains at a high risk  for more acute and chronic complications of diabetes which include CAD, CVA, CKD, retinopathy, and neuropathy. These are all discussed in detail with the patient.  - I have counseled the patient on diet management and weight loss, by adopting a carbohydrate restricted/protein rich diet.  - Suggestion is made for patient to avoid simple carbohydrates   from their diet including Cakes , Desserts, Ice Cream,  Soda (  diet and regular) , Sweet Tea , Candies,  Chips, Cookies, Artificial Sweeteners,   and "Sugar-free" Products . This will help patient to have stable blood glucose  profile and potentially avoid unintended weight gain.  - I encouraged the patient to switch to  unprocessed or minimally processed complex starch and increased protein intake (animal or plant source), fruits, and vegetables.  - Patient is advised to stick to a routine mealtimes to eat 3 meals  a day and avoid unnecessary snacks ( to snack only to correct hypoglycemia).  - The patient will be scheduled with Jearld Fenton, RDN, CDE for individualized DM education.  - I have approached patient with the following individualized plan to manage diabetes and patient agrees:   - Emphasizing the need for strict compliance, I  will continue Levemir 50 units QHS, and  Humalog  10 units TIDAC for pre-meal BG readings of 90-150mg /dl, plus patient specific correction dose for unexpected hyperglycemia above 150mg /dl, associated with strict monitoring of glucose  daily before meals and at bedtime. - Patient is warned not to take insulin without proper monitoring per orders. -Adjustment parameters are given for hypo and hyperglycemia in writing. -Patient is encouraged to call clinic for blood glucose levels less than 70 or above 300 mg /dl. -If she cannot afford insulin analogs, she will be considered for premixed Novolin 70/30 to use twice a day.   -Patient is not a candidate for metformin andSGLT2 inhibitors due to CKD.  - Patient will be considered for incretin therapy as appropriate next visit. - Patient specific target  A1c;  LDL, HDL, Triglycerides, and  Waist Circumference were discussed in detail.  2) BP/HTN: Uncontrolled due to the fact that she did not take her blood pressure medications this morning. I advised her to continue carvedilol 12.5 mg by mouth twice a day lisinopril 10 mg by mouth daily. 3) Lipids/HPL:  Controlled , LDL at 55.  continue statins. 4)  Weight/Diet: CDE Consult initiated , exercise, and detailed carbohydrates information provided.  5) Chronic Care/Health  Maintenance:  -Patient is on ACEI/ARB and Statin medications and encouraged to continue to follow up with Ophthalmology, Podiatrist at least yearly or according to recommendations, and advised to   stay away from smoking. I have recommended yearly flu vaccine and pneumonia vaccination at least every 5 years; moderate intensity exercise for up to 150 minutes weekly; and  sleep for at least 7 hours a day.   Patient to bring meter and  blood glucose logs during their next visit.   I advised patient to maintain close follow up with their PCP for primary care needs. Follow up plan: Return in about 3 months (around 04/28/2016) for meter, and logs.  Glade Lloyd, MD Phone: [phone]  Fax: [phone]   01/29/2016, 11:12 AM

## 2016-03-10 ENCOUNTER — Other Ambulatory Visit: Payer: Self-pay | Admitting: "Endocrinology

## 2016-03-17 ENCOUNTER — Telehealth: Payer: Self-pay | Admitting: "Endocrinology

## 2016-03-17 NOTE — Telephone Encounter (Signed)
Patient left a VM stating that she is having very low AM readings and wants to discuss changing her Levemir  Dosage.  Her last two morning readings are 47 and 57.

## 2016-03-17 NOTE — Telephone Encounter (Signed)
Pt states she has had low BG readings.   Date Before breakfast Before lunch Before supper Bedtime  1/21 49 @ 2a.m. 92 156 160 331  1/22 189 176 163   1/23 54  109  174   133            Pt taking: Lantus 50 units qhs,  humalog 10-16 units TIDAC

## 2016-03-17 NOTE — Telephone Encounter (Signed)
Advise to lower her Lantus to 40 units daily at bedtime, continue Humalog 10 units 3 times a day before meals plus correction.

## 2016-03-18 NOTE — Telephone Encounter (Signed)
Pt.notified

## 2016-04-06 ENCOUNTER — Other Ambulatory Visit: Payer: Self-pay | Admitting: "Endocrinology

## 2016-04-30 ENCOUNTER — Other Ambulatory Visit: Payer: Self-pay | Admitting: "Endocrinology

## 2016-04-30 ENCOUNTER — Ambulatory Visit: Payer: Medicare Other | Admitting: "Endocrinology

## 2016-04-30 LAB — COMPREHENSIVE METABOLIC PANEL
ALK PHOS: 135 U/L — AB (ref 33–130)
ALT: 20 U/L (ref 6–29)
AST: 20 U/L (ref 10–35)
Albumin: 3.3 g/dL — ABNORMAL LOW (ref 3.6–5.1)
BILIRUBIN TOTAL: 0.2 mg/dL (ref 0.2–1.2)
BUN: 50 mg/dL — AB (ref 7–25)
CO2: 25 mmol/L (ref 20–31)
Calcium: 9.3 mg/dL (ref 8.6–10.4)
Chloride: 93 mmol/L — ABNORMAL LOW (ref 98–110)
Creat: 10.36 mg/dL — ABNORMAL HIGH (ref 0.50–0.99)
GLUCOSE: 87 mg/dL (ref 65–99)
Potassium: 4 mmol/L (ref 3.5–5.3)
SODIUM: 139 mmol/L (ref 135–146)
Total Protein: 6.2 g/dL (ref 6.1–8.1)

## 2016-05-02 LAB — HEMOGLOBIN A1C
Hgb A1c MFr Bld: 6.7 % — ABNORMAL HIGH (ref <5.7)
Mean Plasma Glucose: 146 mg/dL

## 2016-05-06 LAB — MICROALBUMIN / CREATININE URINE RATIO

## 2016-05-14 ENCOUNTER — Ambulatory Visit: Payer: Medicare Other | Admitting: "Endocrinology

## 2016-05-18 ENCOUNTER — Ambulatory Visit (INDEPENDENT_AMBULATORY_CARE_PROVIDER_SITE_OTHER): Payer: PRIVATE HEALTH INSURANCE | Admitting: "Endocrinology

## 2016-05-18 ENCOUNTER — Encounter: Payer: Self-pay | Admitting: "Endocrinology

## 2016-05-18 VITALS — BP 141/86 | HR 76 | Ht 65.0 in | Wt 175.0 lb

## 2016-05-18 DIAGNOSIS — E782 Mixed hyperlipidemia: Secondary | ICD-10-CM

## 2016-05-18 DIAGNOSIS — I10 Essential (primary) hypertension: Secondary | ICD-10-CM | POA: Diagnosis not present

## 2016-05-18 DIAGNOSIS — E1122 Type 2 diabetes mellitus with diabetic chronic kidney disease: Secondary | ICD-10-CM

## 2016-05-18 DIAGNOSIS — N186 End stage renal disease: Secondary | ICD-10-CM | POA: Diagnosis not present

## 2016-05-18 MED ORDER — INSULIN DETEMIR 100 UNIT/ML FLEXPEN: 40.0000 [IU] | PEN_INJECTOR | Freq: Every day | SUBCUTANEOUS | 2 refills | Status: DC

## 2016-05-18 NOTE — Progress Notes (Signed)
Subjective:    Patient ID: Selena Rowe, female    DOB: [birth year],    Past Medical History:  Diagnosis Date  . Diabetes mellitus, type II ([place])   . Hyperlipidemia   . Hypertension   . Kidney disease   . Stroke [facility])    Past Surgical History:  Procedure Laterality Date  . ABDOMINAL HYSTERECTOMY    . CESAREAN SECTION    . DIALYSIS FISTULA CREATION     Social History   Social History  . Marital status: Married    Spouse name: N/A  . Number of children: N/A  . Years of education: N/A   Social History Main Topics  . Smoking status: Never Smoker  . Smokeless tobacco: Never Used  . Alcohol use No  . Drug use: No  . Sexual activity: Not Asked   Other Topics Concern  . None   Social History Narrative  . None   Outpatient Encounter Prescriptions as of 05/18/2016  Medication Sig  . aspirin EC 81 MG tablet Take 81 mg by mouth daily.  Marland Kitchen atorvastatin (LIPITOR) 40 MG tablet Take 40 mg by mouth daily.  Lorin Picket 1 GM 210 MG(Fe) TABS   . calcitRIOL (ROCALTROL) 0.25 MCG capsule Take 0.25 mcg by mouth as directed.  . calcium acetate (PHOSLO) 667 MG capsule Take by mouth 3 (three) times daily with meals.  . carvedilol (COREG) 12.5 MG tablet Take 12.5 mg by mouth 2 (two) times daily with a meal.  . doxazosin (CARDURA) 1 MG tablet Take 1 mg by mouth daily.  . ergocalciferol (VITAMIN D2) 50000 UNITS capsule Take 50,000 Units by mouth once a week.  . furosemide (LASIX) 40 MG tablet Take 40 mg by mouth 2 (two) times daily.  Marland Kitchen HUMALOG KWIKPEN 100 UNIT/ML KiwkPen INJECT10-16 UNITS TOTAL SUBCUTANEOUSLY 3 TIMES DAILY.  Marland Kitchen Insulin Detemir (LEVEMIR FLEXTOUCH) 100 UNIT/ML Pen Inject 40 Units into the skin daily at 10 pm.  . Insulin Lispro (HUMALOG KWIKPEN [place]) Inject 10-16 Units into the skin 3 (three) times daily with meals.  Marland Kitchen LEVEMIR FLEXTOUCH 100 UNIT/ML Pen INJECT 60 UNITS SUBCUTANEOUSLY AT BEDTIME  . multivitamin (RENA-VIT) TABS tablet Take 1 tablet by mouth daily.  . vitamin B-12  (CYANOCOBALAMIN) 500 MCG tablet Take 500 mcg by mouth daily.  . [DISCONTINUED] Insulin Detemir (LEVEMIR FLEXTOUCH) 100 UNIT/ML Pen Inject 50 Units into the skin daily at 10 pm. (Patient taking differently: Inject 60 Units into the skin daily at 10 pm. )   No facility-administered encounter medications on file as of 05/18/2016.    ALLERGIES: No Known Allergies VACCINATION STATUS:  There is no immunization history on file for this patient.  Diabetes  She presents for her follow-up diabetic visit. She has type 2 diabetes mellitus. Onset time: She was diagnosed at approximate age of 27 years preceded by history of gestational diabetes in 31. Her disease course has been improving. There are no hypoglycemic associated symptoms. Pertinent negatives for hypoglycemia include no confusion, headaches, pallor or seizures. (She had one episode of hypoglycemia to 40 associated with symptoms including confusion.) Associated symptoms include fatigue and polydipsia. Pertinent negatives for diabetes include no chest pain and no polyphagia. There are no hypoglycemic complications. Symptoms are improving. Diabetic complications include a CVA, nephropathy and retinopathy. Risk factors for coronary artery disease include dyslipidemia, diabetes mellitus, hypertension and sedentary lifestyle. Current diabetic treatment includes insulin injections. She is compliant with treatment some of the time. Her weight is stable. She is following a generally  unhealthy diet. She has had a previous visit with a dietitian. She rarely participates in exercise. Home blood sugar record trend: she did not monitor enough. Her breakfast blood glucose range is generally 140-180 mg/dl. Her lunch blood glucose range is generally 140-180 mg/dl. Her dinner blood glucose range is generally 140-180 mg/dl. Her overall blood glucose range is 140-180 mg/dl. An ACE inhibitor/angiotensin II receptor blocker is being taken. Eye exam is current.  Hypertension   This is a chronic problem. The current episode started more than 1 year ago. The problem is uncontrolled (Patient states she did not take her blood pressure medications this morning.). Pertinent negatives include no chest pain, headaches, palpitations or shortness of breath. Risk factors for coronary artery disease include dyslipidemia, diabetes mellitus and sedentary lifestyle. Past treatments include beta blockers, diuretics and ACE inhibitors. Hypertensive end-organ damage includes CVA and retinopathy.  Hyperlipidemia  This is a chronic problem. The current episode started more than 1 year ago. The problem is uncontrolled. Pertinent negatives include no chest pain, myalgias or shortness of breath. Current antihyperlipidemic treatment includes statins. Risk factors for coronary artery disease include diabetes mellitus, dyslipidemia, hypertension and a sedentary lifestyle.     Review of Systems  Constitutional: Positive for fatigue. Negative for unexpected weight change.  HENT: Negative for trouble swallowing and voice change.   Eyes: Positive for visual disturbance.  Respiratory: Negative for cough, shortness of breath and wheezing.   Cardiovascular: Negative for chest pain, palpitations and leg swelling.  Gastrointestinal: Negative for diarrhea, nausea and vomiting.  Endocrine: Positive for polydipsia. Negative for cold intolerance, heat intolerance and polyphagia.  Musculoskeletal: Negative for arthralgias and myalgias.  Skin: Negative for color change, pallor, rash and wound.  Neurological: Negative for seizures and headaches.  Psychiatric/Behavioral: Negative for confusion and suicidal ideas.    Objective:    BP (!) 141/86   Pulse 76   Ht 5\' 5"  (1.651 m)   Wt 175 lb (79.4 kg)   BMI 29.12 kg/m   Wt Readings from Last 3 Encounters:  05/18/16 175 lb (79.4 kg)  01/29/16 179 lb (81.2 kg)  10/23/15 170 lb (77.1 kg)    Physical Exam  Constitutional: She is oriented to person,  place, and time. She appears well-developed.  HENT:  Head: Normocephalic and atraumatic.  Eyes: EOM are normal.  Neck: Normal range of motion. Neck supple. No tracheal deviation present. No thyromegaly present.  Cardiovascular: Normal rate and regular rhythm.   Pulmonary/Chest: Effort normal and breath sounds normal.  Abdominal: Soft. Bowel sounds are normal. There is no tenderness. There is no guarding.  Musculoskeletal: Normal range of motion. She exhibits no edema.  Neurological: She is alert and oriented to person, place, and time. She has normal reflexes. No cranial nerve deficit. Coordination normal.  Skin: Skin is warm and dry. No rash noted. No erythema. No pallor.  Psychiatric: She has a normal mood and affect. Judgment normal.    Recent Results (from the past 2160 hour(s))  Microalbumin / creatinine urine ratio     Status: None   Collection Time: 04/30/16  9:33 AM  Result Value Ref Range   Creatinine, Urine CANCELED 20 - 320 mg/dL    Comment: Test not performed, no urine was received.    Result canceled by the ancillary    Microalb, Ur CANCELED Not estab mg/dL    Comment: Result canceled by the ancillary   Microalb Creat Ratio CANCELED <30 mcg/mg creat    Comment: The ADA has defined abnormalities  in albumin excretion as follows:           Category           Result                            (mcg/mg creatinine)                 Normal:    <30       Microalbuminuria:    30 - 299   Clinical albuminuria:    > or = 300   The ADA recommends that at least two of three specimens collected within a 3 - 6 month period be abnormal before considering a patient to be within a diagnostic category.    Result canceled by the ancillary   Comprehensive metabolic panel     Status: Abnormal   Collection Time: 04/30/16  9:33 AM  Result Value Ref Range   Sodium 139 135 - 146 mmol/L   Potassium 4.0 3.5 - 5.3 mmol/L   Chloride 93 (L) 98 - 110 mmol/L   CO2 25 20 - 31 mmol/L    Glucose, Bld 87 65 - 99 mg/dL   BUN 50 (H) 7 - 25 mg/dL   Creat 10.36 (H) 0.50 - 0.99 mg/dL    Comment: Result repeated and verified.   For patients > or = 63 years of age: The upper reference limit for Creatinine is approximately 13% higher for people identified as African-American.      Total Bilirubin 0.2 0.2 - 1.2 mg/dL   Alkaline Phosphatase 135 (H) 33 - 130 U/L   AST 20 10 - 35 U/L   ALT 20 6 - 29 U/L   Total Protein 6.2 6.1 - 8.1 g/dL   Albumin 3.3 (L) 3.6 - 5.1 g/dL   Calcium 9.3 8.6 - 10.4 mg/dL  Hemoglobin A1c     Status: Abnormal   Collection Time: 04/30/16  9:33 AM  Result Value Ref Range   Hgb A1c MFr Bld 6.7 (H) <5.7 %    Comment:   For someone without known diabetes, a hemoglobin A1c value of 6.5% or greater indicates that they may have diabetes and this should be confirmed with a follow-up test.   For someone with known diabetes, a value <7% indicates that their diabetes is well controlled and a value greater than or equal to 7% indicates suboptimal control. A1c targets should be individualized based on duration of diabetes, age, comorbid conditions, and other considerations.   Currently, no consensus exists for use of hemoglobin A1c for diagnosis of diabetes for children.      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 -She came with Inadequate monitoring and without her logs. -She has not been consistent on timing of insulin and meals. - Patient has currently uncontrolled symptomatic type 2 DM since  61 years of age,  with most recent A1c Improving to 6.7%, generally improving from 12.6%.   Her diabetes is complicated by end-stage renal disease on dialysis , retinopathy, and cerebrovascular accident and patient remains at a high risk for more acute and chronic complications of diabetes which include CAD, CVA,  CKD, retinopathy, and neuropathy. These are all discussed in detail with the patient.  - I have counseled the patient on diet management and weight loss, by adopting  a carbohydrate restricted/protein rich diet.  - Suggestion is made for patient to avoid simple carbohydrates   from their diet including Cakes , Desserts, Ice Cream,  Soda (  diet and regular) , Sweet Tea , Candies,  Chips, Cookies, Artificial Sweeteners,   and "Sugar-free" Products . This will help patient to have stable blood glucose profile and potentially avoid unintended weight gain.  - I encouraged the patient to switch to  unprocessed or minimally processed complex starch and increased protein intake (animal or plant source), fruits, and vegetables.  - Patient is advised to stick to a routine mealtimes to eat 3 meals  a day and avoid unnecessary snacks ( to snack only to correct hypoglycemia).  - The patient will be scheduled with Jearld Fenton, RDN, CDE for individualized DM education.  - I have approached patient with the following individualized plan to manage diabetes and patient agrees:   - Emphasizing the need for strict compliance, I  will lower Levemir to 40 units daily at bedtime, continue Humalog  10 units TIDAC for pre-meal BG readings of 90-150mg /dl, plus patient specific correction dose for unexpected hyperglycemia above 150mg /dl, associated with strict monitoring of glucose  daily before meals and at bedtime. - Patient is warned not to take insulin without proper monitoring per orders. -Adjustment parameters are given for hypo and hyperglycemia in writing. -Patient is encouraged to call clinic for blood glucose levels less than 70 or above 300 mg /dl. -If she cannot afford insulin analogs, she will be considered for premixed Novolin 70/30 to use twice a day.   -Patient is not a candidate for metformin andSGLT2 inhibitors due to CKD.  - Patient will be considered for incretin therapy as appropriate next  visit. - Patient specific target  A1c;  LDL, HDL, Triglycerides, and  Waist Circumference were discussed in detail.  2) BP/HTN: Uncontrolled due to the fact that she did not take her blood pressure medications this morning. I advised her to continue carvedilol 12.5 mg by mouth twice a day lisinopril 10 mg by mouth daily. 3) Lipids/HPL:  Controlled , LDL at 55.  continue statins. 4)  Weight/Diet: CDE Consult initiated , exercise, and detailed carbohydrates information provided.  5) Chronic Care/Health Maintenance:  -Patient is on ACEI/ARB and Statin medications and encouraged to continue to follow up with Ophthalmology, Podiatrist at least yearly or according to recommendations, and advised to   stay away from smoking. I have recommended yearly flu vaccine and pneumonia vaccination at least every 5 years; moderate intensity exercise for up to 150 minutes weekly; and  sleep for at least 7 hours a day.   Patient to bring meter and  blood glucose logs during their next visit.   I advised patient to maintain close follow up with their PCP for primary care needs. Follow up plan: Return in about 3 months (around 08/18/2016) for follow up with pre-visit labs, meter, and logs.  Glade Lloyd, MD Phone: [phone]  Fax: [phone]   05/18/2016, 4:29 PM

## 2016-07-14 ENCOUNTER — Other Ambulatory Visit: Payer: Self-pay | Admitting: "Endocrinology

## 2016-08-04 LAB — IFOBT (OCCULT BLOOD): IFOBT: POSITIVE

## 2016-08-07 ENCOUNTER — Encounter: Payer: Self-pay | Admitting: Gastroenterology

## 2016-08-07 ENCOUNTER — Ambulatory Visit (INDEPENDENT_AMBULATORY_CARE_PROVIDER_SITE_OTHER): Payer: PRIVATE HEALTH INSURANCE | Admitting: Gastroenterology

## 2016-08-07 VITALS — BP 148/80 | HR 93 | Temp 98.2°F | Ht 65.0 in | Wt 180.4 lb

## 2016-08-07 DIAGNOSIS — D500 Iron deficiency anemia secondary to blood loss (chronic): Secondary | ICD-10-CM

## 2016-08-07 DIAGNOSIS — R195 Other fecal abnormalities: Secondary | ICD-10-CM | POA: Diagnosis not present

## 2016-08-07 DIAGNOSIS — D649 Anemia, unspecified: Secondary | ICD-10-CM | POA: Insufficient documentation

## 2016-08-07 LAB — HEMOGLOBIN AND HEMATOCRIT, BLOOD
HEMATOCRIT: 26.9 % — AB (ref 35.0–45.0)
HEMOGLOBIN: 8.5 g/dL — AB (ref 11.7–15.5)

## 2016-08-07 NOTE — Patient Instructions (Signed)
1. Please have your labs done when you leave the office. I will call you with results later today.  2. We will obtain copy of your records from Dr. Nyoka Cowden and once reviewed we will get you scheduled for likely colonoscopy and upper endoscopy if appropriate.  3. If you have black stools, bloody stools, shortness of breath, dizziness/lightheadedness, go to the nearest er.

## 2016-08-07 NOTE — Progress Notes (Signed)
LMOAM stating "labs improved" and that we will call with "plan". Please see ov addendum for need for tcs+/-egd.

## 2016-08-07 NOTE — Progress Notes (Addendum)
Primary Care Physician:  Dhivianathan, Candida Peeling, MD  Primary Gastroenterologist:  Barney Drain, MD   Chief Complaint  Patient presents with  . low HGB    HPI:  Selena Rowe is a 61 y.o. female here urgent Consultation for left can decline in hemoglobin, heme positive stools at the request of Dr. Marlowe Sax, her nephrologist. Patient has been on peritoneal dialysis for 2 years. She reports typical hemoglobin in the 10 range. When she had her blood checked on 07/29/2016 her hemoglobin was 6.4, hematocrit 19.2, MCV 95. Iron normal at 89, TIBC normal at 289, iron saturations 31%. Ferritin 1637. She is heme positive. She got 2 units of blood last Friday. Continues to feel fatigued. Denies any lightheadedness, shortness of breath, dizziness. She knew she was tired but didn't know that she was significantly anemic.  She denies melena or bright red blood per rectum. She has 2-3 bowel movements daily, ranging from [place] 1 to [place] 4. Denies abdominal pain. She has a easily reducible umbilical hernia which does not cause her any pain. She denies heartburn or dysphagia. Her appetite is good. Denies weight loss. Only complaint is coughing with change in weather/seasons. Worse over the past couple weeks. Complains of postnasal drip, urge to clear her throat which usually leads to coughing up phlegm which is clear. Then she is okay for a while. Sometimes worse at night.   Had been taking naproxen for about 2 weeks but this is been stopped. Takes aspirin 81 mg daily. No other NSAIDs.  She reports previous evaluation for anemia by Drs. PandyaPatel. She cannot remember when her last colonoscopy and upper endoscopy were but she believes it was within the past 2-3 years. She denies significant findings.  She is in the process of kidney transplant evaluation. She is planning on umbilical hernia repair but her surgeon would like for her to have an option for hemodialysis if she loses peritoneal dialysis  access.  Hemoglobin 11.4 on 06/11/2016. Down to 6 recently.   Current Outpatient Prescriptions  Medication Sig Dispense Refill  . aspirin EC 81 MG tablet Take 81 mg by mouth daily.    Marland Kitchen atorvastatin (LIPITOR) 40 MG tablet Take 40 mg by mouth daily.    . calcitRIOL (ROCALTROL) 0.25 MCG capsule Take 0.25 mcg by mouth as directed.    . carvedilol (COREG) 12.5 MG tablet Take 25 mg by mouth 2 (two) times daily with a meal.     . doxazosin (CARDURA) 1 MG tablet Take 1 mg by mouth daily.    . ergocalciferol (VITAMIN D2) 50000 UNITS capsule Take 50,000 Units by mouth once a week.    . gabapentin (NEURONTIN) 100 MG capsule Take 100 mg by mouth 3 (three) times daily.    . hydrALAZINE (APRESOLINE) 50 MG tablet Take 50 mg by mouth 2 (two) times daily.    . Insulin Detemir (LEVEMIR FLEXTOUCH) 100 UNIT/ML Pen Inject 40 Units into the skin daily at 10 pm. 15 mL 2  . Insulin Lispro (HUMALOG KWIKPEN [place]) Inject 10-16 Units into the skin 3 (three) times daily with meals.    . multivitamin (RENA-VIT) TABS tablet Take 1 tablet by mouth daily.    . SENSIPAR 30 MG tablet Take 30 mg by mouth daily with supper.     . sevelamer carbonate (RENVELA) 800 MG tablet Take 2,400 mg by mouth 3 (three) times daily with meals.     . vitamin B-12 (CYANOCOBALAMIN) 500 MCG tablet Take 500 mcg by mouth daily.  No current facility-administered medications for this visit.     Allergies as of 08/07/2016 - Review Complete 08/07/2016  Allergen Reaction Noted  . Amlodipine Swelling 08/07/2016    Past Medical History:  Diagnosis Date  . Diabetes mellitus, type II ([place])   . Hyperlipidemia   . Hypertension   . Kidney disease    on diaylsis since 2016  . Stroke [facility]) 2014    Past Surgical History:  Procedure Laterality Date  . ABDOMINAL HYSTERECTOMY    . CESAREAN SECTION    . COLONOSCOPY  09/2013   Dr. Earley Brooke: Performed for IDA due to blood loss, normal colonoscopy  . COLONOSCOPY  12/2005   Dr. Earley Brooke: hemorrhoids   . DIALYSIS FISTULA CREATION    . ESOPHAGOGASTRODUODENOSCOPY  02/2014   Dr. Earley Brooke: Hiatal hernia, few erosions of the antrum status post biopsy for clotest, small bowel biopsy showed mild chronic inflammation of the duodenum with changes of celiac disease not seen  . ESOPHAGOGASTRODUODENOSCOPY  12/2005   Dr. Alethia Berthold, Hiatal hernia  . TUBAL LIGATION      Family History  Problem Relation Age of Onset  . Hypertension Mother   . Kidney disease Sister   . Lung cancer Sister   . Colon cancer Neg Hx     Social History   Social History  . Marital status: Married    Spouse name: N/A  . Number of children: N/A  . Years of education: N/A   Occupational History  . Not on file.   Social History Main Topics  . Smoking status: Never Smoker  . Smokeless tobacco: Never Used  . Alcohol use No  . Drug use: No  . Sexual activity: Not on file   Other Topics Concern  . Not on file   Social History Narrative  . No narrative on file      ROS:  General: Negative for anorexia, weight loss, fever, chills,  Weakness. +FATIGUE Eyes: Negative for vision changes.  ENT: Negative for hoarseness, difficulty swallowing , nasal congestion. CV: Negative for chest pain, angina, palpitations, dyspnea on exertion, peripheral edema.  Respiratory: Negative for dyspnea at rest, dyspnea on exertion, cough, sputum, wheezing.  GI: See history of present illness. GU:  Negative for dysuria, hematuria, urinary incontinence, urinary frequency, nocturnal urination.  MS: Negative for joint pain, low back pain.  Derm: Negative for rash or itching.  Neuro: Negative for weakness, abnormal sensation, seizure, frequent headaches, memory loss, confusion.  Psych: Negative for anxiety, depression, suicidal ideation, hallucinations.  Endo: Negative for unusual weight change.  Heme: Negative for bruising or bleeding. Allergy: Negative for rash or hives.    Physical Examination:  BP (!) 148/80 (BP Location: Left  Arm, Cuff Size: Normal)   Pulse 93   Temp 98.2 F (36.8 C) (Oral)   Ht 5\' 5"  (1.651 m)   Wt 180 lb 6.4 oz (81.8 kg)   BMI 30.02 kg/m    General: Well-nourished, well-developed in no acute distress.  Head: Normocephalic, atraumatic.   Eyes: Conjunctiva pink, no icterus. Mouth: Oropharyngeal mucosa moist and pink , no lesions erythema or exudate. Neck: Supple without thyromegaly, masses, or lymphadenopathy.  Lungs: Clear to auscultation bilaterally.  Heart: Regular rate and rhythm, no murmurs rubs or gallops.  Abdomen: Bowel sounds are normal, nontender, nondistended, no hepatosplenomegaly or masses, no abdominal bruits, no rebound or guarding.  Small umb hernia easily reducible and nontender. Rectus diastasis. Rectal: deferred Extremities: No lower extremity edema. No clubbing or deformities.  Neuro: Alert and  oriented x 4 , grossly normal neurologically.  Skin: Warm and dry, no rash or jaundice.   Psych: Alert and cooperative, normal mood and affect.  Labs: Labs from January 2018, hepatitis B surface antigen negative, hepatitis B core antibody total negative, hepatitis B surface antibody positive, HCV antibody nonreactive she completed hepatitis B vaccination in 2016. Last June 2018, hemoglobin A1c 8.5, folate greater than 20  Labs from April 2018, total bilirubin 0.2, alkaline phosphatase 114, AST 23, ALT 23, albumin 3.5.    Imaging Studies: No results found.

## 2016-08-07 NOTE — Assessment & Plan Note (Addendum)
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 No overt gi bleeding noted. No significant gi symptoms. Patient complains of significant fatigue but no lightheadedness or dizziness. Takes asa 81 mg daily. Recent naprosyn for 2 weeks which as been stopped. Suspect occult gi bleeding as cause of decline in hgb recently possible due to PUD, AVMs, underlying malignancy not ruled out.   Given recent EGD/TCS we will obtain copy for review before scheduling the appropriate testing. We will update H/H today. Patient aware if she has a significant decline in hemoglobin or develops lightheadedness, shortness of breath, melena or rectal bleeding she will need to go to the ED for management of the weekend.

## 2016-08-07 NOTE — Progress Notes (Addendum)
Records from Dr. Earley Brooke received and PSH updated. Last EGD/TCS 02/2014.   Given significant decline in Hgb in last 2 months from 11 to 6, heme + stool, no overt gi bleeding:  Plan for TCS +/- EGD with SLF . Augment conscious sedation with phenergan 12.5mg  IV 45 minutes before.  Day of bowel prep: 1/2 dose Levemir and humalog. AM of procedures: hold diabetes medications.

## 2016-08-10 ENCOUNTER — Other Ambulatory Visit: Payer: Self-pay

## 2016-08-10 DIAGNOSIS — D649 Anemia, unspecified: Secondary | ICD-10-CM

## 2016-08-10 MED ORDER — PEG 3350-KCL-NA BICARB-NACL 420 G PO SOLR: 4000.0000 mL | ORAL | 0 refills | Status: DC

## 2016-08-10 NOTE — Progress Notes (Signed)
Please recheck her H/H in 2 weeks.

## 2016-08-10 NOTE — Progress Notes (Signed)
Let me know when you have a date set. We may need to update labs depending on how far out she is scheduled. Really need asap.

## 2016-08-10 NOTE — Progress Notes (Signed)
CC'ED TO PCP 

## 2016-08-10 NOTE — Progress Notes (Signed)
Noted  

## 2016-08-10 NOTE — Progress Notes (Signed)
Pt is set up for TCS on 09/09/16.

## 2016-08-10 NOTE — Progress Notes (Signed)
I did not receive an addendum. Forwarding to [place] since pt was seen in office on 08/07/2016. PLEASE SEE THE INSTRUCTIONS FOR [place] UNDER 08/07/2016 NOTES.

## 2016-08-11 ENCOUNTER — Telehealth: Payer: Self-pay

## 2016-08-11 NOTE — Telephone Encounter (Signed)
noted 

## 2016-08-11 NOTE — Telephone Encounter (Signed)
Clemon Chambers called office and said that Dr. Marlowe Sax is ok with pt having Tri-Lyte.

## 2016-08-11 NOTE — Telephone Encounter (Signed)
Selena Rowe at [facility] called about pt's colonoscopy prep. Pt was given Tri-Lyte. She is questioning pt drinking that much fluid and asked about a pill prep. Told her our office doesn't do a pill prep. Her insurance may not cover a small volume prep. Informed her I would ask LSL and call her back. [phone]).  Routing to LSL.

## 2016-08-11 NOTE — Telephone Encounter (Signed)
Patient does peritoneal dialysis. Typically short volume preps (ie Moviprep/suprep/osmoprep/clenpiq) are not recommend for renal patient's due to intravascular volume issues. Trilyte should basically pass through the colon.     Please contact patient's nephrologist and ask if okay for use of trilyte.

## 2016-08-11 NOTE — Telephone Encounter (Signed)
Called Selena Rowe at [facility]. Informed her of message from LSL. She said she will contact the pt's nephrologist and call our office back.

## 2016-08-24 LAB — HEMOGLOBIN AND HEMATOCRIT, BLOOD
HEMATOCRIT: 31.2 % — AB (ref 35.0–45.0)
Hemoglobin: 9.8 g/dL — ABNORMAL LOW (ref 11.7–15.5)

## 2016-08-27 NOTE — Progress Notes (Signed)
Please let patient know that Hgb slightly improved. Procedures as planned. Await TCS/EGD findings.

## 2016-08-28 NOTE — Progress Notes (Signed)
Pt is aware.  

## 2016-08-31 ENCOUNTER — Telehealth: Payer: Self-pay

## 2016-08-31 NOTE — Telephone Encounter (Signed)
[place] for PA for TCS/+/-EGD that is scheduled for 09/09/16. Information given and she will call office back.

## 2016-08-31 NOTE — Telephone Encounter (Signed)
Wells Guiles at [facility] called. PA# is [phone]. Called and informed Kimmie at pre-service center.

## 2016-09-04 ENCOUNTER — Ambulatory Visit: Payer: Medicare Other | Admitting: "Endocrinology

## 2016-09-04 ENCOUNTER — Telehealth: Payer: Self-pay

## 2016-09-04 NOTE — Telephone Encounter (Signed)
Called and informed pt.  

## 2016-09-04 NOTE — Telephone Encounter (Signed)
Pt called office. Her cardiologist started her on Plavix 09/01/16 and stopped Aspirin. She is scheduled for TCS/+/-EGD 09/09/16. Routing to [facility] to see if she needs to hold Plavix.

## 2016-09-04 NOTE — Telephone Encounter (Signed)
Plavix ok for procedures.

## 2016-09-09 ENCOUNTER — Ambulatory Visit (HOSPITAL_COMMUNITY)
Admission: RE | Admit: 2016-09-09 | Discharge: 2016-09-09 | Disposition: A | Discharge status: Home / Self Care | Payer: PRIVATE HEALTH INSURANCE | Source: Ambulatory Visit | Attending: Gastroenterology | Admitting: Gastroenterology

## 2016-09-09 ENCOUNTER — Other Ambulatory Visit: Payer: Self-pay

## 2016-09-09 ENCOUNTER — Encounter (HOSPITAL_COMMUNITY): Payer: Self-pay | Admitting: *Deleted

## 2016-09-09 ENCOUNTER — Encounter (HOSPITAL_COMMUNITY): Payer: Self-pay

## 2016-09-09 ENCOUNTER — Ambulatory Visit (HOSPITAL_COMMUNITY): Admit: 2016-09-09 | Payer: BLUE CROSS/BLUE SHIELD | Admitting: Gastroenterology

## 2016-09-09 ENCOUNTER — Encounter (HOSPITAL_COMMUNITY)
Admission: RE | Disposition: A | Discharge status: Home / Self Care | Payer: Self-pay | Source: Ambulatory Visit | Attending: Gastroenterology

## 2016-09-09 ENCOUNTER — Telehealth: Payer: Self-pay

## 2016-09-09 ENCOUNTER — Telehealth: Payer: Self-pay | Admitting: Gastroenterology

## 2016-09-09 DIAGNOSIS — Z794 Long term (current) use of insulin: Secondary | ICD-10-CM | POA: Insufficient documentation

## 2016-09-09 DIAGNOSIS — K648 Other hemorrhoids: Secondary | ICD-10-CM | POA: Insufficient documentation

## 2016-09-09 DIAGNOSIS — N289 Disorder of kidney and ureter, unspecified: Secondary | ICD-10-CM | POA: Insufficient documentation

## 2016-09-09 DIAGNOSIS — D123 Benign neoplasm of transverse colon: Secondary | ICD-10-CM | POA: Diagnosis not present

## 2016-09-09 DIAGNOSIS — K5730 Diverticulosis of large intestine without perforation or abscess without bleeding: Secondary | ICD-10-CM | POA: Diagnosis not present

## 2016-09-09 DIAGNOSIS — Z8673 Personal history of transient ischemic attack (TIA), and cerebral infarction without residual deficits: Secondary | ICD-10-CM | POA: Diagnosis not present

## 2016-09-09 DIAGNOSIS — Z79899 Other long term (current) drug therapy: Secondary | ICD-10-CM | POA: Insufficient documentation

## 2016-09-09 DIAGNOSIS — E785 Hyperlipidemia, unspecified: Secondary | ICD-10-CM | POA: Insufficient documentation

## 2016-09-09 DIAGNOSIS — K2950 Unspecified chronic gastritis without bleeding: Secondary | ICD-10-CM | POA: Insufficient documentation

## 2016-09-09 DIAGNOSIS — D649 Anemia, unspecified: Secondary | ICD-10-CM | POA: Diagnosis not present

## 2016-09-09 DIAGNOSIS — B9681 Helicobacter pylori [H. pylori] as the cause of diseases classified elsewhere: Secondary | ICD-10-CM | POA: Insufficient documentation

## 2016-09-09 DIAGNOSIS — K2970 Gastritis, unspecified, without bleeding: Secondary | ICD-10-CM

## 2016-09-09 DIAGNOSIS — K449 Diaphragmatic hernia without obstruction or gangrene: Secondary | ICD-10-CM | POA: Diagnosis not present

## 2016-09-09 DIAGNOSIS — D120 Benign neoplasm of cecum: Secondary | ICD-10-CM | POA: Diagnosis not present

## 2016-09-09 DIAGNOSIS — E119 Type 2 diabetes mellitus without complications: Secondary | ICD-10-CM | POA: Diagnosis not present

## 2016-09-09 DIAGNOSIS — Z7902 Long term (current) use of antithrombotics/antiplatelets: Secondary | ICD-10-CM | POA: Insufficient documentation

## 2016-09-09 DIAGNOSIS — Z992 Dependence on renal dialysis: Secondary | ICD-10-CM | POA: Diagnosis not present

## 2016-09-09 DIAGNOSIS — I10 Essential (primary) hypertension: Secondary | ICD-10-CM | POA: Insufficient documentation

## 2016-09-09 HISTORY — PX: ESOPHAGOGASTRODUODENOSCOPY: SHX5428

## 2016-09-09 HISTORY — PX: BIOPSY: SHX5522

## 2016-09-09 HISTORY — PX: POLYPECTOMY: SHX5525

## 2016-09-09 HISTORY — PX: COLONOSCOPY: SHX5424

## 2016-09-09 LAB — CBC
HCT: 33.3 % — ABNORMAL LOW (ref 36.0–46.0)
HEMOGLOBIN: 10.4 g/dL — AB (ref 12.0–15.0)
MCH: 30.1 pg (ref 26.0–34.0)
MCHC: 31.2 g/dL (ref 30.0–36.0)
MCV: 96.2 fL (ref 78.0–100.0)
PLATELETS: 349 10*3/uL (ref 150–400)
RBC: 3.46 MIL/uL — AB (ref 3.87–5.11)
RDW: 16.4 % — ABNORMAL HIGH (ref 11.5–15.5)
WBC: 8.3 10*3/uL (ref 4.0–10.5)

## 2016-09-09 LAB — GLUCOSE, CAPILLARY: GLUCOSE-CAPILLARY: 93 mg/dL (ref 65–99)

## 2016-09-09 LAB — VITAMIN B12: VITAMIN B 12: 3994 pg/mL — AB (ref 180–914)

## 2016-09-09 SURGERY — COLONOSCOPY WITH PROPOFOL
Anesthesia: Monitor Anesthesia Care

## 2016-09-09 SURGERY — COLONOSCOPY
Anesthesia: Moderate Sedation

## 2016-09-09 MED ORDER — STERILE WATER FOR IRRIGATION IR SOLN
Status: DC | PRN
  Administered 2016-09-09: 2.5 mL

## 2016-09-09 MED ORDER — SODIUM CHLORIDE 0.9 % IV SOLN
INTRAVENOUS | Status: DC
  Administered 2016-09-09: 10:00:00 via INTRAVENOUS

## 2016-09-09 MED ORDER — FENTANYL CITRATE (PF) 100 MCG/2ML IJ SOLN
INTRAMUSCULAR | Status: DC | PRN
  Administered 2016-09-09 (×4): 25 ug via INTRAVENOUS

## 2016-09-09 MED ORDER — LIDOCAINE VISCOUS 2 % MT SOLN
OROMUCOSAL | Status: DC | PRN
  Administered 2016-09-09: 1 via OROMUCOSAL

## 2016-09-09 MED ORDER — LIDOCAINE VISCOUS 2 % MT SOLN
OROMUCOSAL | Status: AC
  Filled 2016-09-09: qty 15

## 2016-09-09 MED ORDER — MIDAZOLAM HCL 5 MG/5ML IJ SOLN
INTRAMUSCULAR | Status: DC | PRN
  Administered 2016-09-09 (×2): 2 mg via INTRAVENOUS
  Administered 2016-09-09: 1 mg via INTRAVENOUS
  Administered 2016-09-09: 2 mg via INTRAVENOUS

## 2016-09-09 MED ORDER — MIDAZOLAM HCL 5 MG/5ML IJ SOLN
INTRAMUSCULAR | Status: AC
  Filled 2016-09-09: qty 10

## 2016-09-09 MED ORDER — MEPERIDINE HCL 100 MG/ML IJ SOLN
INTRAMUSCULAR | Status: AC
  Filled 2016-09-09: qty 2

## 2016-09-09 MED ORDER — PROMETHAZINE HCL 25 MG/ML IJ SOLN: 12.5000 mg | Freq: Once | INTRAMUSCULAR | Status: DC

## 2016-09-09 MED ORDER — FENTANYL CITRATE (PF) 100 MCG/2ML IJ SOLN
INTRAMUSCULAR | Status: AC
  Filled 2016-09-09: qty 4

## 2016-09-09 NOTE — Telephone Encounter (Signed)
Pt is set up for [facility] on 09/14/16 @ 7:00 am. Instructions are in the mail. I have left a message for her to call back.

## 2016-09-09 NOTE — H&P (Signed)
Primary Care Physician:  Dhivianathan, Candida Peeling, MD Primary Gastroenterologist:  Dr. Oneida Alar  Pre-Procedure History & Physical: HPI:  Selena Rowe is a 61 y.o. female here for [place].  Past Medical History:  Diagnosis Date  . Diabetes mellitus, type II ([place])   . Hyperlipidemia   . Hypertension   . Kidney disease    on diaylsis since 2016  . Stroke [facility]) 2014    Past Surgical History:  Procedure Laterality Date  . ABDOMINAL HYSTERECTOMY    . CESAREAN SECTION    . COLONOSCOPY  09/2013   Dr. Earley Brooke: Performed for IDA due to blood loss, normal colonoscopy  . COLONOSCOPY  12/2005   Dr. Earley Brooke: hemorrhoids  . DIALYSIS FISTULA CREATION    . ESOPHAGOGASTRODUODENOSCOPY  02/2014   Dr. Earley Brooke: Hiatal hernia, few erosions of the antrum status post biopsy for clotest, small bowel biopsy showed mild chronic inflammation of the duodenum with changes of celiac disease not seen  . ESOPHAGOGASTRODUODENOSCOPY  12/2005   Dr. Alethia Berthold, Hiatal hernia  . TUBAL LIGATION      Prior to Admission medications   Medication Sig Start Date End Date Taking? Authorizing Provider  acetaminophen (TYLENOL) 500 MG tablet Take 1,000-1,500 mg by mouth at bedtime as needed for moderate pain or headache.   Yes [provider]  amoxicillin (AMOXIL) 500 MG capsule Take 2,000 mg by mouth as directed. 1 hour prior to procedures   Yes [provider]  atorvastatin (LIPITOR) 40 MG tablet Take 40 mg by mouth every evening.    Yes [provider]  calcitRIOL (ROCALTROL) 0.25 MCG capsule Take 0.25 mcg by mouth every Monday, Wednesday, and Friday.    Yes [provider]  carvedilol (COREG) 25 MG tablet Take 25 mg by mouth 2 (two) times daily.   Yes [provider]  clopidogrel (PLAVIX) 75 MG tablet Take 75 mg by mouth daily.   Yes [provider]  Cyanocobalamin (B-12) 3000 MCG CAPS Take 3,000 mcg by mouth daily.   Yes [provider]   doxazosin (CARDURA) 1 MG tablet Take 1 mg by mouth at bedtime.    Yes [provider]  ergocalciferol (VITAMIN D2) 50000 UNITS capsule Take 50,000 Units by mouth every 30 (thirty) days.    Yes [provider]  gabapentin (NEURONTIN) 100 MG capsule Take 100 mg by mouth daily as needed (pain).    Yes [provider]  hydrALAZINE (APRESOLINE) 50 MG tablet Take 50 mg by mouth 2 (two) times daily.   Yes [provider]  Insulin Detemir (LEVEMIR FLEXTOUCH) 100 UNIT/ML Pen Inject 40 Units into the skin daily at 10 pm. 05/18/16  Yes Nida, Marella Chimes, MD  insulin lispro (HUMALOG KWIKPEN) 100 UNIT/ML KiwkPen Inject 10-15 Units into the skin 3 (three) times daily as needed (high blood sugar). Per sliding scale   Yes [provider]  multivitamin (RENA-VIT) TABS tablet Take 1 tablet by mouth daily.   Yes [provider]  polyethylene glycol-electrolytes (TRILYTE) 420 g solution Take 4,000 mLs by mouth as directed. 08/10/16  Yes Karyn Brull, Marga Melnick, MD  sucroferric oxyhydroxide (VELPHORO) 500 MG chewable tablet Chew 500 mg by mouth 3 (three) times daily with meals.   Yes [provider]    Allergies as of 08/10/2016 - Review Complete 08/07/2016  Allergen Reaction Noted  . Amlodipine Swelling 08/07/2016    Family History  Problem Relation Age of Onset  . Hypertension Mother   . Kidney disease Sister   .  Lung cancer Sister   . Colon cancer Neg Hx     Social History   Social History  . Marital status: Married    Spouse name: N/A  . Number of children: N/A  . Years of education: N/A   Occupational History  . Not on file.   Social History Main Topics  . Smoking status: Never Smoker  . Smokeless tobacco: Never Used  . Alcohol use No  . Drug use: No  . Sexual activity: Not on file   Other Topics Concern  . Not on file   Social History Narrative  . No narrative on file    Review of Systems: See HPI, otherwise negative  ROS   Physical Exam: BP (!) 148/68   Pulse 83   Temp 98.3 F (36.8 C) (Oral)   Resp 15   Ht 5\' 4"  (1.626 m)   SpO2 99%  General:   Alert,  pleasant and cooperative in NAD Head:  Normocephalic and atraumatic. Neck:  Supple; Lungs:  Clear throughout to auscultation.    Heart:  Regular rate and rhythm. Abdomen:  Soft, nontender and nondistended. Normal bowel sounds, without guarding, and without rebound.   Neurologic:  Alert and  oriented x4;  grossly normal neurologically.  Impression/Plan:     Anemia-TRANSFUSION DEPENDENT ANEMIA  PLAN:  1. TCS/EGD TODAY. DISCUSSED PROCEDURE, BENEFITS, & RISKS: < 1% chance of medication reaction, bleeding, perforation, or rupture of spleen/liver.

## 2016-09-09 NOTE — Op Note (Signed)
[facility] Patient Name: Selena Rowe Procedure Date: 09/09/2016 11:42 AM MRN: [id number] Date of Birth: 09/03/1955 Attending MD: Barney Drain , MD CSN: [id number] Age: 61 Admit Type: Outpatient Procedure:                Upper GI endoscopy WITH COLD FORCEPS BIOPSY Indications:              Anemia Providers:                Barney Drain, MD, Lurline Del, RN, Charlyne Petrin                            RN, RN Referring MD:             Kristine Linea MD, MD Medicines:                TCS+ Midazolam 2 mg IV Complications:            No immediate complications. Estimated Blood Loss:     Estimated blood loss was minimal. Procedure:                Pre-Anesthesia Assessment:                           - Prior to the procedure, a History and Physical                            was performed, and patient medications and                            allergies were reviewed. The patient's tolerance of                            previous anesthesia was also reviewed. The risks                            and benefits of the procedure and the sedation                            options and risks were discussed with the patient.                            All questions were answered, and informed consent                            was obtained. Prior Anticoagulants: The patient has                            taken Plavix (clopidogrel), last dose was 1 day                            prior to procedure. ASA Grade Assessment: II - A                            patient with mild systemic disease. After reviewing  the risks and benefits, the patient was deemed in                            satisfactory condition to undergo the procedure.                            After obtaining informed consent, the endoscope was                            passed under direct vision. Throughout the                            procedure, the patient's blood pressure, pulse, and               oxygen saturations were monitored continuously. The                            [id number] ([id number]) scope was introduced through the                            mouth, and advanced to the second part of duodenum.                            The upper GI endoscopy was somewhat difficult due                            to the patient's agitation. Successful completion                            of the procedure was aided by performing the                            maneuvers documented (below) in this report. The                            patient tolerated the procedure fairly well. Scope In: 11:47:12 AM Scope Out: 11:59:50 AM Total Procedure Duration: 0 hours 12 minutes 38 seconds  Findings:      The examined esophagus was normal.      Scattered moderate inflammation characterized by congestion (edema),       erosions and erythema was found in the gastric body, in the gastric       antrum and in the prepyloric region of the stomach. Biopsies were taken       with a cold forceps for Helicobacter pylori testing.      A medium-sized hiatal hernia was present.      Scattered moderate inflammation characterized by congestion (edema),       erosions and erythema was found in the duodenal bulb. Biopsies for       histology were taken with a cold forceps for evaluation of celiac       disease.      The second portion of the duodenum was normal. Impression:               - NO SOURCE FOR Hb 6 IDENTIFIED.                           -  MODERATE EROSIVE Gastritis/DUODENITIS. Moderate Sedation:      Moderate (conscious) sedation was administered by the endoscopy nurse       and supervised by the endoscopist. The following parameters were       monitored: oxygen saturation, heart rate, blood pressure, and response       to care. Total physician intraservice time was 58 minutes. Recommendation:           - Await pathology results. CHECK SPEP.                           - Resume previous diet.  AGILE CAPSULE WITHIN 7                            DAYS. ANTICPATE GIVENS CAPSULE.                           - Continue present medications.                           - Return to my office in 3 months.                           - Patient has a contact number available for                            emergencies. The signs and symptoms of potential                            delayed complications were discussed with the                            patient. Return to normal activities tomorrow.                            Written discharge instructions were provided to the                            patient. Procedure Code(s):        --- Professional ---                           [phone], Esophagogastroduodenoscopy, flexible,                            transoral; with biopsy, single or multiple                           99152, Moderate sedation services provided by the                            same physician or other qualified health care                            professional performing the diagnostic or  therapeutic service that the sedation supports,                            requiring the presence of an independent trained                            observer to assist in the monitoring of the                            patient's level of consciousness and physiological                            status; initial 15 minutes of intraservice time,                            patient age 51 years or older                           [phone], Moderate sedation services; each additional                            15 minutes intraservice time                           99153, Moderate sedation services; each additional                            15 minutes intraservice time                           99153, Moderate sedation services; each additional                            15 minutes intraservice time Diagnosis Code(s):        --- Professional ---                            K29.70, Gastritis, unspecified, without bleeding                           D64.9, Anemia, unspecified CPT copyright 2016 American [employer] Association. All rights reserved. The codes documented in this report are preliminary and upon coder review may  be revised to meet current compliance requirements. Barney Drain, MD Barney Drain, MD 09/09/2016 12:22:23 PM This report has been signed electronically. Number of Addenda: 0

## 2016-09-09 NOTE — Op Note (Signed)
[facility] Patient Name: Selena Rowe Procedure Date: 09/09/2016 10:27 AM MRN: [id number] Date of Birth: 25-Nov-1955 Attending MD: Barney Drain , MD CSN: [id number] Age: 61 Admit Type: Outpatient Procedure:                Colonoscopy with cold forceps polypectomy Indications:              Anemia-Hb 6 and no BRBPR/MELENA Providers:                Barney Drain, MD, Lurline Del, RN, Charlyne Petrin                            RN, RN Referring MD:             Kristine Linea MD, MD Medicines:                Fentanyl 100 micrograms IV, Midazolam 5 mg IV Complications:            No immediate complications. Estimated Blood Loss:     Estimated blood loss was minimal. Procedure:                Pre-Anesthesia Assessment:                           - Prior to the procedure, a History and Physical                            was performed, and patient medications and                            allergies were reviewed. The patient's tolerance of                            previous anesthesia was also reviewed. The risks                            and benefits of the procedure and the sedation                            options and risks were discussed with the patient.                            All questions were answered, and informed consent                            was obtained. Prior Anticoagulants: The patient has                            taken Plavix (clopidogrel), last dose was 1 day                            prior to procedure. ASA Grade Assessment: II - A                            patient with mild systemic disease. After reviewing  the risks and benefits, the patient was deemed in                            satisfactory condition to undergo the procedure.                            After obtaining informed consent, the colonoscope                            was passed under direct vision. Throughout the                            procedure, the  patient's blood pressure, pulse, and                            oxygen saturations were monitored continuously. The                            [id number] ([id number]) scope was introduced through                            the anus and advanced to the 3 cm into the ileum.                            The colonoscopy was technically difficult and                            complex due to restricted mobility of the colon and                            significant looping. Successful completion of the                            procedure was aided by increasing the dose of                            sedation medication, changing the patient to a                            supine position and COLOWRAP. The patient tolerated                            the procedure fairly well. The quality of the bowel                            preparation was good. The terminal ileum, ileocecal                            valve, appendiceal orifice, and rectum were                            photographed. Scope In: 11:12:38 AM Scope Out: 11:39:04 AM Scope Withdrawal Time: 0 hours 16 minutes 43  seconds  Total Procedure Duration: 0 hours 26 minutes 26 seconds  Findings:      The terminal ileum appeared normal.      Two sessile polyps were found in the hepatic flexure and cecum. The       polyps were 2 to 4 mm in size. These polyps were removed with a cold       biopsy forceps. Resection and retrieval were complete.      Multiple small-mouthed diverticula were found in the sigmoid colon,       descending colon and transverse colon.      Internal hemorrhoids were found during retroflexion. The hemorrhoids       were small. Impression:               - NO SOURCE FOR ANEMIA IDENTIFIED                           - Two 2 to 4 mm polyps at the hepatic flexure and                            in the cecum, removed with a cold biopsy forceps.                            Resected and retrieved.                           -  Diverticulosis in the sigmoid colon, in the                            descending colon and in the transverse colon.                           - Internal hemorrhoids. Moderate Sedation:      Moderate (conscious) sedation was administered by the endoscopy nurse       and supervised by the endoscopist. The following parameters were       monitored: oxygen saturation, heart rate, blood pressure, and response       to care. Total physician intraservice time was 58 minutes. Recommendation:           - Repeat colonoscopy in 5-10 years for surveillance.                           - High fiber diet.                           - Continue present medications.                           - Await pathology results.                           - Patient has a contact number available for                            emergencies. The signs and symptoms of potential  delayed complications were discussed with the                            patient. Return to normal activities tomorrow.                            Written discharge instructions were provided to the                            patient. Procedure Code(s):        --- Professional ---                           [phone], Colonoscopy, flexible; with biopsy, single                            or multiple                           99152, Moderate sedation services provided by the                            same physician or other qualified health care                            professional performing the diagnostic or                            therapeutic service that the sedation supports,                            requiring the presence of an independent trained                            observer to assist in the monitoring of the                            patient's level of consciousness and physiological                            status; initial 15 minutes of intraservice time,                            patient age 48 years  or older                           [phone], Moderate sedation services; each additional                            15 minutes intraservice time                           99153, Moderate sedation services; each additional                            15 minutes  intraservice time                           [phone], Moderate sedation services; each additional                            15 minutes intraservice time Diagnosis Code(s):        --- Professional ---                           K64.8, Other hemorrhoids                           D12.3, Benign neoplasm of transverse colon (hepatic                            flexure or splenic flexure)                           D12.0, Benign neoplasm of cecum                           D64.9, Anemia, unspecified                           K57.30, Diverticulosis of large intestine without                            perforation or abscess without bleeding CPT copyright 2016 American Medical Association. All rights reserved. The codes documented in this report are preliminary and upon coder review may  be revised to meet current compliance requirements. Barney Drain, MD Barney Drain, MD 09/09/2016 12:16:06 PM This report has been signed electronically. Number of Addenda: 0

## 2016-09-09 NOTE — Telephone Encounter (Signed)
Forwarding to [facility]. 

## 2016-09-09 NOTE — Discharge Instructions (Signed)
You had 2 polyps removed. You have small internal hemorrhoids & MILD DIVERTICULOSIS IN YOUR RIGHT AND LEFT COLON. You have MODERATE EROSIVE gastritis, & a duodenitis.  I biopsied your stomach AND SMALL BOWEL. NO SOURCE FOR YOUR ANEMIA(LOW BLOOD COUNT) WAS IDENTIFIED.    CONTINUE YOUR WEIGHT LOSS EFFORTS. LOSE 10 MORE LBS.  DRINK WATER TO KEEP YOUR URINE LIGHT YELLOW.  FOLLOW A HIGH FIBER/LOW FAT DIET. AVOID ITEMS THAT CAUSE BLOATING. SEE INFO BELOW.  YOUR BIOPSY RESULTS WILL BE AVAILABLE IN MY CHART AFTER JUL 21 AND MY OFFICE WILL CONTACT YOU IN 10-14 DAYS WITH YOUR RESULTS.   AGILE CAPSULE WITHIN 7 DAYS AND PLAN FOR GIVENS CAPSULE IF THE AGILE CAPSULE PASSES THROUGH.  Next colonoscopy in 5-10 years.   FOLLOW UP IN 3 MOS.   ENDOSCOPY Care After Read the instructions outlined below and refer to this sheet in the next week. These discharge instructions provide you with general information on caring for yourself after you leave the hospital. While your treatment has been planned according to the most current medical practices available, unavoidable complications occasionally occur. If you have any problems or questions after discharge, call DR. Teesha Ohm, [phone].  ACTIVITY  You may resume your regular activity, but move at a slower pace for the next 24 hours.   Take frequent rest periods for the next 24 hours.   Walking will help get rid of the air and reduce the bloated feeling in your belly (abdomen).   No driving for 24 hours (because of the medicine (anesthesia) used during the test).   You may shower.   Do not sign any important legal documents or operate any machinery for 24 hours (because of the anesthesia used during the test).    NUTRITION  Drink plenty of fluids.   You may resume your normal diet as instructed by your doctor.   Begin with a light meal and progress to your normal diet. Heavy or fried foods are harder to digest and may make you feel sick to your  stomach (nauseated).   Avoid alcoholic beverages for 24 hours or as instructed.    MEDICATIONS  You may resume your normal medications.   WHAT YOU CAN EXPECT TODAY  Some feelings of bloating in the abdomen.   Passage of more gas than usual.   Spotting of blood in your stool or on the toilet paper  .  IF YOU HAD POLYPS REMOVED DURING THE ENDOSCOPY:  Eat a soft diet IF YOU HAVE NAUSEA, BLOATING, ABDOMINAL PAIN, OR VOMITING.    FINDING OUT THE RESULTS OF YOUR TEST Not all test results are available during your visit. DR. Oneida Alar WILL CALL YOU WITHIN 14 DAYS OF YOUR PROCEDUE WITH YOUR RESULTS. Do not assume everything is normal if you have not heard from DR. Lior Cartelli, CALL HER OFFICE AT [phone].  SEEK IMMEDIATE MEDICAL ATTENTION AND CALL THE OFFICE: [phone] IF:  You have more than a spotting of blood in your stool.   Your belly is swollen (abdominal distention).   You are nauseated or vomiting.   You have a temperature over 101F.   You have abdominal pain or discomfort that is severe or gets worse throughout the day.   Gastritis/DUODENITIS  Gastritis/DUODENITIS is inflammation (the body's way of reacting to injury and/or infection) of the stomach/SMALLBOWEL. It is often caused by viral or bacterial (germ) infections. It can also be caused BY ASPIRIN, BC/GOODY POWDER'S, (IBUPROFEN) MOTRIN, OR ALEVE (NAPROXEN), chemicals (including alcohol), SPICY FOODS, and medications.  This illness may be associated with generalized malaise (feeling tired, not well), UPPER ABDOMINAL STOMACH cramps, and fever. One common bacterial cause of gastritis is an organism known as H. Pylori. This can be treated with antibiotics.    High-Fiber Diet A high-fiber diet changes your normal diet to include more whole grains, legumes, fruits, and vegetables. Changes in the diet involve replacing refined carbohydrates with unrefined foods. The calorie level of the diet is essentially unchanged. The  Dietary Reference Intake (recommended amount) for adult males is 38 grams per day. For adult females, it is 25 grams per day. Pregnant and lactating women should consume 28 grams of fiber per day. Fiber is the intact part of a plant that is not broken down during digestion. Functional fiber is fiber that has been isolated from the plant to provide a beneficial effect in the body. PURPOSE  Increase stool bulk.   Ease and regulate bowel movements.   Lower cholesterol.   REDUCE RISK OF COLON CANCER  INDICATIONS THAT YOU NEED MORE FIBER  Constipation and hemorrhoids.   Uncomplicated diverticulosis (intestine condition) and irritable bowel syndrome.   Weight management.   As a protective measure against hardening of the arteries (atherosclerosis), diabetes, and cancer.   GUIDELINES FOR INCREASING FIBER IN THE DIET  Start adding fiber to the diet slowly. A gradual increase of about 5 more grams (2 slices of whole-wheat bread, 2 servings of most fruits or vegetables, or 1 bowl of high-fiber cereal) per day is best. Too rapid an increase in fiber may result in constipation, flatulence, and bloating.   Drink enough water and fluids to keep your urine clear or pale yellow. Water, juice, or caffeine-free drinks are recommended. Not drinking enough fluid may cause constipation.   Eat a variety of high-fiber foods rather than one type of fiber.   Try to increase your intake of fiber through using high-fiber foods rather than fiber pills or supplements that contain small amounts of fiber.   The goal is to change the types of food eaten. Do not supplement your present diet with high-fiber foods, but replace foods in your present diet.   INCLUDE A VARIETY OF FIBER SOURCES  Replace refined and processed grains with whole grains, canned fruits with fresh fruits, and incorporate other fiber sources. White rice, white breads, and most bakery goods contain little or no fiber.   Brown whole-grain  rice, buckwheat oats, and many fruits and vegetables are all good sources of fiber. These include: broccoli, Brussels sprouts, cabbage, cauliflower, beets, sweet potatoes, white potatoes (skin on), carrots, tomatoes, eggplant, squash, berries, fresh fruits, and dried fruits.   Cereals appear to be the richest source of fiber. Cereal fiber is found in whole grains and bran. Bran is the fiber-rich outer coat of cereal grain, which is largely removed in refining. In whole-grain cereals, the bran remains. In breakfast cereals, the largest amount of fiber is found in those with "bran" in their names. The fiber content is sometimes indicated on the label.   You may need to include additional fruits and vegetables each day.   In baking, for 1 cup white flour, you may use the following substitutions:   1 cup whole-wheat flour minus 2 tablespoons.   1/2 cup white flour plus 1/2 cup whole-wheat flour.   Diverticulosis Diverticulosis is a common condition that develops when small pouches (diverticula) form in the wall of the colon. The risk of diverticulosis increases with age. It happens more often in people who  eat a low-fiber diet. Most individuals with diverticulosis have no symptoms. Those individuals with symptoms usually experience belly (abdominal) pain, constipation, or loose stools (diarrhea).  HOME CARE INSTRUCTIONS  Increase the amount of fiber in your diet as directed by your caregiver or dietician. This may reduce symptoms of diverticulosis.   Drink at least 6 to 8 glasses of water each day to prevent constipation.   Try not to strain when you have a bowel movement.   Avoiding nuts and seeds to prevent complications is NOT NECESSARY.   FOODS HAVING HIGH FIBER CONTENT INCLUDE:  Fruits. Apple, peach, pear, tangerine, raisins, prunes.   Vegetables. Brussels sprouts, asparagus, broccoli, cabbage, carrot, cauliflower, romaine lettuce, spinach, summer squash, tomato, winter squash,  zucchini.   Starchy Vegetables. Baked beans, kidney beans, lima beans, split peas, lentils, potatoes (with skin).   Grains. Whole wheat bread, brown rice, bran flake cereal, plain oatmeal, white rice, shredded wheat, bran muffins.   Polyps, Colon  A polyp is extra tissue that grows inside your body. Colon polyps grow in the large intestine. The large intestine, also called the colon, is part of your digestive system. It is a long, hollow tube at the end of your digestive tract where your body makes and stores stool. Most polyps are not dangerous. They are benign. This means they are not cancerous. But over time, some types of polyps can turn into cancer. Polyps that are smaller than a pea are usually not harmful. But larger polyps could someday become or may already be cancerous. To be safe, doctors remove all polyps and test them.   WHO GETS POLYPS? Anyone can get polyps, but certain people are more likely than others. You may have a greater chance of getting polyps if:  You are over 50.   You have had polyps before.   Someone in your family has had polyps.   Someone in your family has had cancer of the large intestine.   Find out if someone in your family has had polyps. You may also be more likely to get polyps if you:   Eat a lot of fatty foods   Smoke   Drink alcohol   Do not exercise  Eat too much   TREATMENT  The caregiver will remove the polyp during sigmoidoscopy or colonoscopy.   PREVENTION There is not one sure way to prevent polyps. You might be able to lower your risk of getting them if you:  Eat more fruits and vegetables and less fatty food.   Do not smoke.   Avoid alcohol.   Exercise every day.   Lose weight if you are overweight.   Eating more calcium and folate can also lower your risk of getting polyps. Some foods that are rich in calcium are milk, cheese, and broccoli. Some foods that are rich in folate are chickpeas, kidney beans, and spinach.

## 2016-09-09 NOTE — Telephone Encounter (Signed)
AGILE CAPSULE WITHIN 7 DAYS, Dx: NORMOCYTIC ANEMIA. WILL PLAN FOR GIVENS CAPSULE IF THE AGILE CAPSULE PASSES.

## 2016-09-10 LAB — FOLATE RBC
Folate, Hemolysate: >620 ng/mL
Hematocrit: 32.2 % — ABNORMAL LOW (ref 34.0–46.6)

## 2016-09-10 NOTE — Telephone Encounter (Signed)
Pt is aware of appointment 

## 2016-09-11 ENCOUNTER — Encounter (HOSPITAL_COMMUNITY): Payer: Self-pay | Admitting: Gastroenterology

## 2016-09-14 ENCOUNTER — Encounter (HOSPITAL_COMMUNITY)
Admission: AD | Disposition: A | Discharge status: Home / Self Care | Payer: Self-pay | Source: Ambulatory Visit | Attending: Gastroenterology

## 2016-09-14 ENCOUNTER — Ambulatory Visit (HOSPITAL_COMMUNITY)
Admission: AD | Admit: 2016-09-14 | Discharge: 2016-09-14 | Disposition: A | Discharge status: Home / Self Care | Payer: PRIVATE HEALTH INSURANCE | Source: Ambulatory Visit | Attending: Gastroenterology | Admitting: Gastroenterology

## 2016-09-14 ENCOUNTER — Telehealth: Payer: Self-pay | Admitting: Gastroenterology

## 2016-09-14 ENCOUNTER — Encounter (HOSPITAL_COMMUNITY): Admission: RE | Payer: Self-pay | Source: Ambulatory Visit

## 2016-09-14 DIAGNOSIS — R1310 Dysphagia, unspecified: Secondary | ICD-10-CM | POA: Diagnosis present

## 2016-09-14 DIAGNOSIS — T183XXS Foreign body in small intestine, sequela: Secondary | ICD-10-CM

## 2016-09-14 HISTORY — PX: AGILE CAPSULE: SHX5420

## 2016-09-14 LAB — PROTEIN ELECTROPHORESIS, SERUM
A/G Ratio: 0.9 (ref 0.7–1.7)
ALPHA-1-GLOBULIN: 0.3 g/dL (ref 0.0–0.4)
ALPHA-2-GLOBULIN: 1.1 g/dL — AB (ref 0.4–1.0)
Albumin ELP: 2.9 g/dL (ref 2.9–4.4)
BETA GLOBULIN: 1 g/dL (ref 0.7–1.3)
Gamma Globulin: 0.8 g/dL (ref 0.4–1.8)
Globulin, Total: 3.1 g/dL (ref 2.2–3.9)
M-SPIKE, %: 0.2 g/dL — AB
Total Protein ELP: 6 g/dL (ref 6.0–8.5)

## 2016-09-14 LAB — GLUCOSE, CAPILLARY: GLUCOSE-CAPILLARY: 166 mg/dL — AB (ref 65–99)

## 2016-09-14 SURGERY — AGILE CAPSULE

## 2016-09-14 MED ORDER — BIS SUBCIT-METRONID-TETRACYC 140-125-125 MG PO CAPS: ORAL_CAPSULE | ORAL | 0 refills | Status: DC

## 2016-09-14 MED ORDER — PANTOPRAZOLE SODIUM 40 MG PO TBEC: DELAYED_RELEASE_TABLET | ORAL | 11 refills | Status: DC

## 2016-09-14 MED ORDER — SIMETHICONE 40 MG/0.6ML PO SUSP
ORAL | Status: AC
  Filled 2016-09-14: qty 30

## 2016-09-14 NOTE — Telephone Encounter (Signed)
PLEASE CALL PT. She has H. Pylori gastritis. She has BEEN EXPOSED TO AMOXICILLIN SO SHE NEEDS PYLERA 3 PILLS QID FOR 10 DAYS. She should take PROTONIX BID for 30 days the once daily. The meds can cause nausea, vomiting, abd cramps, loose stools, black colored stools, and metallic taste in her mouth.  SHE HAD TWO SIMPLE ADENOMAS REMOVED.   HER FOLATE AND B12 LEVELS ARE SUPER NORMAL. HER SERUM ELECTROPHORESIS IS NOT NORMAL. 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   WE WILL CALL HER WITH HER AGILE RESULTS AND IF IT PASSES SHE SHOULD COMPLETE THE GIVENS CAPSULE STUDY.   FOLLOW UP IN 3 MOS E30 H PYLORI GASTRITIS, ANEMIA.  NEXT COLONOSCOPY IN 5-10 YEARS.

## 2016-09-15 ENCOUNTER — Other Ambulatory Visit: Payer: Self-pay

## 2016-09-15 ENCOUNTER — Ambulatory Visit (HOSPITAL_COMMUNITY): Payer: PRIVATE HEALTH INSURANCE

## 2016-09-15 DIAGNOSIS — D472 Monoclonal gammopathy: Secondary | ICD-10-CM

## 2016-09-15 DIAGNOSIS — R1310 Dysphagia, unspecified: Secondary | ICD-10-CM | POA: Diagnosis not present

## 2016-09-15 NOTE — Telephone Encounter (Signed)
APPOINTMENT SCHEDULED AND ON RECALL  °

## 2016-09-15 NOTE — Telephone Encounter (Signed)
PT is aware of all of the info.  She will call if there is problems getting Pylera.

## 2016-09-15 NOTE — Telephone Encounter (Signed)
Hematology referral has been made

## 2016-09-16 ENCOUNTER — Encounter (HOSPITAL_COMMUNITY): Payer: Self-pay | Admitting: Gastroenterology

## 2016-09-17 ENCOUNTER — Telehealth: Payer: Self-pay | Admitting: Gastroenterology

## 2016-09-17 ENCOUNTER — Ambulatory Visit (HOSPITAL_COMMUNITY)
Admission: RE | Admit: 2016-09-17 | Discharge: 2016-09-17 | Disposition: A | Discharge status: Home / Self Care | Payer: PRIVATE HEALTH INSURANCE | Source: Ambulatory Visit | Attending: Gastroenterology | Admitting: Gastroenterology

## 2016-09-17 ENCOUNTER — Other Ambulatory Visit: Payer: Self-pay

## 2016-09-17 DIAGNOSIS — X58XXXS Exposure to other specified factors, sequela: Secondary | ICD-10-CM | POA: Diagnosis not present

## 2016-09-17 DIAGNOSIS — D649 Anemia, unspecified: Secondary | ICD-10-CM

## 2016-09-17 DIAGNOSIS — T183XXS Foreign body in small intestine, sequela: Secondary | ICD-10-CM | POA: Insufficient documentation

## 2016-09-17 NOTE — Telephone Encounter (Signed)
PT is aware. Forwarding to [facility] to schedule.

## 2016-09-17 NOTE — Telephone Encounter (Signed)
PT is aware. Forwarding to [facility] to schedule the KUB.

## 2016-09-17 NOTE — Telephone Encounter (Signed)
PLEASE CALL PT. HER AGILE CAPSULE IS IN HER TRANSVERSE COLON. SCHEDULE GIVENS CAPSULE FOR NEXT MON.

## 2016-09-17 NOTE — Telephone Encounter (Signed)
Called and informed pt that she can go to [facility] anytime to have KUB, she doesn't need an appt.

## 2016-09-17 NOTE — Addendum Note (Signed)
Addended by: Danie Binder on: 09/17/2016 01:02 PM   Modules accepted: Orders

## 2016-09-17 NOTE — Telephone Encounter (Signed)
Called pt. Givens Capsule Study scheduled for 09/21/16 at 7:30am, pt to arrive at 7:00am. Instructions given on phone. She said she doesn't take Iron, Carafate, Aspirin, or arthritis medications. Orders entered. Message sent to [facility] to read Givens.

## 2016-09-17 NOTE — Telephone Encounter (Signed)
PLEASE CALL PT. Her capsule is in her distal small bowel. She needs another KUB UPRIGHT that shows it has passed and if so we can proceed to the [place].

## 2016-09-17 NOTE — Telephone Encounter (Signed)
To [facility].

## 2016-09-18 ENCOUNTER — Telehealth: Payer: Self-pay

## 2016-09-18 NOTE — Telephone Encounter (Signed)
Pre-cert center called this morning and said that she needs a PA for her GIVENS. I talked with Wells Guiles at [facility] and she said that she does not need a PA for GIVENS.

## 2016-09-21 ENCOUNTER — Ambulatory Visit (HOSPITAL_COMMUNITY)
Admission: RE | Admit: 2016-09-21 | Discharge: 2016-09-21 | Disposition: A | Discharge status: Home / Self Care | Payer: Medicare Other | Source: Ambulatory Visit | Attending: Gastroenterology | Admitting: Gastroenterology

## 2016-09-21 ENCOUNTER — Encounter (HOSPITAL_COMMUNITY)
Admission: RE | Disposition: A | Discharge status: Home / Self Care | Payer: Self-pay | Source: Ambulatory Visit | Attending: Gastroenterology

## 2016-09-21 DIAGNOSIS — K5730 Diverticulosis of large intestine without perforation or abscess without bleeding: Secondary | ICD-10-CM | POA: Insufficient documentation

## 2016-09-21 DIAGNOSIS — Z9851 Tubal ligation status: Secondary | ICD-10-CM | POA: Diagnosis not present

## 2016-09-21 DIAGNOSIS — K2980 Duodenitis without bleeding: Secondary | ICD-10-CM | POA: Diagnosis not present

## 2016-09-21 DIAGNOSIS — K269 Duodenal ulcer, unspecified as acute or chronic, without hemorrhage or perforation: Secondary | ICD-10-CM | POA: Diagnosis not present

## 2016-09-21 DIAGNOSIS — N186 End stage renal disease: Secondary | ICD-10-CM | POA: Diagnosis not present

## 2016-09-21 DIAGNOSIS — K449 Diaphragmatic hernia without obstruction or gangrene: Secondary | ICD-10-CM | POA: Diagnosis not present

## 2016-09-21 DIAGNOSIS — D649 Anemia, unspecified: Secondary | ICD-10-CM | POA: Insufficient documentation

## 2016-09-21 DIAGNOSIS — Z79899 Other long term (current) drug therapy: Secondary | ICD-10-CM | POA: Insufficient documentation

## 2016-09-21 DIAGNOSIS — E1122 Type 2 diabetes mellitus with diabetic chronic kidney disease: Secondary | ICD-10-CM | POA: Diagnosis not present

## 2016-09-21 DIAGNOSIS — Z992 Dependence on renal dialysis: Secondary | ICD-10-CM | POA: Diagnosis not present

## 2016-09-21 DIAGNOSIS — Z7902 Long term (current) use of antithrombotics/antiplatelets: Secondary | ICD-10-CM | POA: Insufficient documentation

## 2016-09-21 DIAGNOSIS — Z794 Long term (current) use of insulin: Secondary | ICD-10-CM | POA: Insufficient documentation

## 2016-09-21 DIAGNOSIS — Z888 Allergy status to other drugs, medicaments and biological substances status: Secondary | ICD-10-CM | POA: Diagnosis not present

## 2016-09-21 DIAGNOSIS — Z8249 Family history of ischemic heart disease and other diseases of the circulatory system: Secondary | ICD-10-CM | POA: Insufficient documentation

## 2016-09-21 DIAGNOSIS — Z801 Family history of malignant neoplasm of trachea, bronchus and lung: Secondary | ICD-10-CM | POA: Insufficient documentation

## 2016-09-21 DIAGNOSIS — D123 Benign neoplasm of transverse colon: Secondary | ICD-10-CM | POA: Diagnosis not present

## 2016-09-21 DIAGNOSIS — D631 Anemia in chronic kidney disease: Secondary | ICD-10-CM | POA: Insufficient documentation

## 2016-09-21 DIAGNOSIS — Z841 Family history of disorders of kidney and ureter: Secondary | ICD-10-CM | POA: Insufficient documentation

## 2016-09-21 DIAGNOSIS — I120 Hypertensive chronic kidney disease with stage 5 chronic kidney disease or end stage renal disease: Secondary | ICD-10-CM | POA: Insufficient documentation

## 2016-09-21 DIAGNOSIS — Z8673 Personal history of transient ischemic attack (TIA), and cerebral infarction without residual deficits: Secondary | ICD-10-CM | POA: Insufficient documentation

## 2016-09-21 DIAGNOSIS — K648 Other hemorrhoids: Secondary | ICD-10-CM | POA: Insufficient documentation

## 2016-09-21 DIAGNOSIS — E785 Hyperlipidemia, unspecified: Secondary | ICD-10-CM | POA: Diagnosis not present

## 2016-09-21 HISTORY — PX: GIVENS CAPSULE STUDY: SHX5432

## 2016-09-21 SURGERY — IMAGING PROCEDURE, GI TRACT, INTRALUMINAL, VIA CAPSULE

## 2016-09-22 ENCOUNTER — Ambulatory Visit (HOSPITAL_BASED_OUTPATIENT_CLINIC_OR_DEPARTMENT_OTHER): Payer: Medicare Other | Admitting: Oncology

## 2016-09-22 ENCOUNTER — Encounter (HOSPITAL_COMMUNITY): Payer: Medicare Other | Attending: Oncology

## 2016-09-22 ENCOUNTER — Ambulatory Visit (HOSPITAL_COMMUNITY): Payer: Medicare Other

## 2016-09-22 ENCOUNTER — Ambulatory Visit (HOSPITAL_COMMUNITY)
Admission: RE | Admit: 2016-09-22 | Discharge: 2016-09-22 | Disposition: A | Discharge status: Home / Self Care | Payer: PRIVATE HEALTH INSURANCE | Source: Ambulatory Visit | Attending: Oncology | Admitting: Oncology

## 2016-09-22 ENCOUNTER — Encounter (HOSPITAL_COMMUNITY): Payer: Self-pay

## 2016-09-22 ENCOUNTER — Telehealth: Payer: Self-pay | Admitting: Gastroenterology

## 2016-09-22 DIAGNOSIS — N186 End stage renal disease: Secondary | ICD-10-CM | POA: Diagnosis not present

## 2016-09-22 DIAGNOSIS — I7090 Unspecified atherosclerosis: Secondary | ICD-10-CM | POA: Insufficient documentation

## 2016-09-22 DIAGNOSIS — M4185 Other forms of scoliosis, thoracolumbar region: Secondary | ICD-10-CM | POA: Insufficient documentation

## 2016-09-22 DIAGNOSIS — D649 Anemia, unspecified: Secondary | ICD-10-CM | POA: Diagnosis not present

## 2016-09-22 DIAGNOSIS — D472 Monoclonal gammopathy: Secondary | ICD-10-CM | POA: Insufficient documentation

## 2016-09-22 DIAGNOSIS — M47892 Other spondylosis, cervical region: Secondary | ICD-10-CM | POA: Diagnosis not present

## 2016-09-22 LAB — CBC WITH DIFFERENTIAL/PLATELET
BASOS ABS: 0.1 10*3/uL (ref 0.0–0.1)
Basophils Relative: 1 %
Eosinophils Absolute: 0.3 10*3/uL (ref 0.0–0.7)
Eosinophils Relative: 3 %
HEMATOCRIT: 33.5 % — AB (ref 36.0–46.0)
HEMOGLOBIN: 10.5 g/dL — AB (ref 12.0–15.0)
LYMPHS PCT: 32 %
Lymphs Abs: 3.5 10*3/uL (ref 0.7–4.0)
MCH: 29.2 pg (ref 26.0–34.0)
MCHC: 31.3 g/dL (ref 30.0–36.0)
MCV: 93.1 fL (ref 78.0–100.0)
MONO ABS: 0.9 10*3/uL (ref 0.1–1.0)
Monocytes Relative: 8 %
NEUTROS ABS: 6.1 10*3/uL (ref 1.7–7.7)
Neutrophils Relative %: 56 %
Platelets: 388 10*3/uL (ref 150–400)
RBC: 3.6 MIL/uL — AB (ref 3.87–5.11)
RDW: 16.6 % — ABNORMAL HIGH (ref 11.5–15.5)
WBC: 10.8 10*3/uL — ABNORMAL HIGH (ref 4.0–10.5)

## 2016-09-22 LAB — COMPREHENSIVE METABOLIC PANEL
ALBUMIN: 2.8 g/dL — AB (ref 3.5–5.0)
ALK PHOS: 125 U/L (ref 38–126)
ALT: 5 U/L — AB (ref 14–54)
AST: 17 U/L (ref 15–41)
Anion gap: 16 — ABNORMAL HIGH (ref 5–15)
BILIRUBIN TOTAL: 0.6 mg/dL (ref 0.3–1.2)
BUN: 46 mg/dL — AB (ref 6–20)
CALCIUM: 9.4 mg/dL (ref 8.9–10.3)
CHLORIDE: 94 mmol/L — AB (ref 101–111)
CO2: 26 mmol/L (ref 22–32)
CREATININE: 14.29 mg/dL — AB (ref 0.44–1.00)
GFR calc Af Amer: 3 mL/min — ABNORMAL LOW (ref >60)
GFR calc non Af Amer: 2 mL/min — ABNORMAL LOW (ref >60)
Glucose, Bld: 126 mg/dL — ABNORMAL HIGH (ref 65–99)
Potassium: 3.9 mmol/L (ref 3.5–5.1)
Sodium: 136 mmol/L (ref 135–145)
Total Protein: 7.1 g/dL (ref 6.5–8.1)

## 2016-09-22 LAB — VITAMIN B12: VITAMIN B 12: 6564 pg/mL — AB (ref 180–914)

## 2016-09-22 LAB — FERRITIN: FERRITIN: 1285 ng/mL — AB (ref 11–307)

## 2016-09-22 LAB — IRON AND TIBC
IRON: 76 ug/dL (ref 28–170)
Saturation Ratios: 33 % — ABNORMAL HIGH (ref 10.4–31.8)
TIBC: 231 ug/dL — AB (ref 250–450)
UIBC: 155 ug/dL

## 2016-09-22 NOTE — Telephone Encounter (Signed)
LMOM to call.

## 2016-09-22 NOTE — Procedures (Addendum)
PT PRESENTED WITH Hb 6.27 Jul 2016 AT AN OUTSIDE FACILITY.  NO SOURCE IDENTIFIED IN EGD/TCS AT [facility]. SPEP SUGGESTS MONOCLONAL GAMMOPATHY.  EGD FOUND H PYLORI GASTRITIS.  PATIENT DATA: GASTRIC PASSAGE TIME: 46m, SB PASSAGE TIME: 5H 63m (READING SPEED: 20)  RESULTS: LIMITED views of gastric mucosa due to retained contents.  No ULCERS, masses or AVMs seen. OCCASIONAL OBSCURED VIEW OF SMALL BOWEL.  LIMITED VIEWS OF THE COLON DUE TO RETAINED CONTENTS. No old blood or fresh blood in the stomach, small bowel, or colon.  DIAGNOSIS: SMALL BOWEL ULCER V. HEALING BIOPSY SITE SEEN IN DUODENAL BULB. NO SOURCE FOR Hb 6 IDENTIFIED.  Plan: 1. HEMATOLOGY REFERRAL TO EVALUATE FOR FOR MONOCLONAL GAMMOPATHY 2. OPV IN 4 MOS. CONFIRM ERADICATION OF H PYLORI GASTRITIS.

## 2016-09-22 NOTE — Telephone Encounter (Signed)
PT is aware and was seen by Hematology today.

## 2016-09-22 NOTE — Telephone Encounter (Signed)
PLEASE CALL PT. SHE HAD ONE SMALL ULCER SEEN ON THE GIVENS CAPSULE. OTHERWISE NO SOURCE FOR HER LOW BLOOD COUNT WAS IDENTIFIED.   Plan: 1. SEE HEMATOLOGY TO EVALUATE YOUR ANEMIA 2. OPV IN 4 MOS E30 H PYLORI GASTRITIS/ANEMIA. WE WILL NEED TO CONFIRM ERADICATION OF H PYLORI GASTRITIS AFTER YOUR NEXT APPT.

## 2016-09-22 NOTE — Progress Notes (Signed)
[facility] Initial Visit:  Patient Care Team: Dhivianathan, Candida Peeling, MD as PCP - General (Family Medicine)  CHIEF COMPLAINTS/PURPOSE OF CONSULTATION:  Monoclonal gammopathy Anemia  HISTORY OF PRESENTING ILLNESS: Selena Rowe 61 y.o. 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 A few weeks ago patient was found to have a hemoglobin of 6 g/dL an outside hospital. She recently underwent EGD/colonoscopy on 09/09/16 by Dr. Oneida Alar. EGD demonstrated moderate erosive gastritis and duodenitis. Colonoscopy demonstrated no source for anemia, two 2 to 4 mm polyps at the hepatic flexure and in the cecum were removed, diverticulosis in the sigmoid colon, descending colon, transverse colon, as well as internal hemorrhoids. Stomach biopsy demonstrated a mild chronic gastritis, rare H. pylori present, no intestinal metaplasia identified. While none biopsy demonstrated benign duodenal mucosa. Colon polyps demonstrated tubular adenoma, no high-grade dysplasia or malignancy identified. Givens capsule demonstrated a small bowel ulcer. An SPEP was checked on 09/09/16 as part of her anemia workup demonstrated a faint band in the beta region which may represent a monoclonal gammopathy. No immunofixation was performed. Her most recent CBC from 09/09/16 demonstrated hemoglobin 10.4 g/dL, hematocrit 33.3%.  Patient has ESRD and does home peritoneal dialysis daily. Patient recently had a left arm fistula placed a few weeks ago however she does not know when the plan would be for her to start hemodialysis. She denies any bone pain she denies any GI bleeding to her knowledge. She denies any melena or hematochezia. She states her stools always look dark, like spinach, due to the volphoro that she takes. She is also on Aranesp or Procrit through her nephrologist monthly.   Review of Systems  Constitutional: Negative for appetite change, chills, fatigue and fever.  HENT:   Negative for  hearing loss, lump/mass, mouth sores, sore throat and tinnitus.   Eyes: Negative for eye problems and icterus.  Respiratory: Negative for chest tightness, cough, hemoptysis, shortness of breath and wheezing.   Cardiovascular: Negative for chest pain, leg swelling and palpitations.  Gastrointestinal: Negative for abdominal distention, abdominal pain, blood in stool, diarrhea, nausea and vomiting.  Endocrine: Negative.  Negative for hot flashes.  Genitourinary: Negative for difficulty urinating, frequency and hematuria.   Musculoskeletal: Negative for arthralgias and neck pain.  Skin: Negative for itching and rash.  Neurological: Negative for dizziness, headaches and speech difficulty.  Hematological: Negative for adenopathy. Does not bruise/bleed easily.  Psychiatric/Behavioral: Negative for confusion. The patient is not nervous/anxious.     MEDICAL HISTORY: Past Medical History:  Diagnosis Date  . Diabetes mellitus, type II ([place])   . Hyperlipidemia   . Hypertension   . Kidney disease    on diaylsis since 2016  . Stroke [facility]) 2014    SURGICAL HISTORY: Past Surgical History:  Procedure Laterality Date  . ABDOMINAL HYSTERECTOMY    . AGILE CAPSULE N/A 09/14/2016   Procedure: AGILE CAPSULE;  Surgeon: Danie Binder, MD;  [facility];  Service: Endoscopy;  Laterality: N/A;  . BIOPSY  09/09/2016   Procedure: BIOPSY;  Surgeon: Danie Binder, MD;  Location: [facility];  Service: Endoscopy;;  duodenal;gastric  . CESAREAN SECTION    . COLONOSCOPY  09/2013   Dr. Earley Brooke: Performed for IDA due to blood loss, normal colonoscopy  . COLONOSCOPY  12/2005   Dr. Earley Brooke: hemorrhoids  . COLONOSCOPY N/A 09/09/2016   Procedure: COLONOSCOPY;  Surgeon: Danie Binder, MD;  Location: [facility];  Service: Endoscopy;  Laterality: N/A;  1015  .  DIALYSIS FISTULA CREATION    . ESOPHAGOGASTRODUODENOSCOPY  02/2014   Dr. Earley Brooke: Hiatal hernia, few erosions of the antrum status post biopsy  for clotest, small bowel biopsy showed mild chronic inflammation of the duodenum with changes of celiac disease not seen  . ESOPHAGOGASTRODUODENOSCOPY  12/2005   Dr. Alethia Berthold, Hiatal hernia  . ESOPHAGOGASTRODUODENOSCOPY N/A 09/09/2016   Procedure: ESOPHAGOGASTRODUODENOSCOPY (EGD);  Surgeon: Danie Binder, MD;  [facility];  Service: Endoscopy;  Laterality: N/A;  . POLYPECTOMY  09/09/2016   Procedure: POLYPECTOMY;  Surgeon: Danie Binder, MD;  Location: [facility];  Service: Endoscopy;;  cecal and hepatic flexure polyp  . TUBAL LIGATION      SOCIAL HISTORY: Social History   Social History  . Marital status: Married    Spouse name: N/A  . Number of children: N/A  . Years of education: N/A   Occupational History  . Not on file.   Social History Main Topics  . Smoking status: Never Smoker  . Smokeless tobacco: Never Used  . Alcohol use No  . Drug use: No  . Sexual activity: Not on file   Other Topics Concern  . Not on file   Social History Narrative  . No narrative on file    FAMILY HISTORY Family History  Problem Relation Age of Onset  . Hypertension Mother   . Kidney disease Sister   . Lung cancer Sister   . Colon cancer Neg Hx     ALLERGIES:  is allergic to amlodipine.  MEDICATIONS:  No current outpatient prescriptions on file.   No current facility-administered medications for this visit.     PHYSICAL EXAMINATION:  ECOG PERFORMANCE STATUS: 0 - Asymptomatic   Vitals:   09/22/16 1321  BP: (!) 152/67  Pulse: 87  Resp: 20  Temp: 98.4 F (36.9 C)    Filed Weights   09/22/16 1321  Weight: 172 lb (78 kg)     Physical Exam  Constitutional: She is oriented to person, place, and time and well-developed, well-nourished, and in no distress. No distress.  HENT:  Head: Normocephalic and atraumatic.  Mouth/Throat: No oropharyngeal exudate.  Eyes: Pupils are equal, round, and reactive to light. Conjunctivae are normal. No scleral  icterus.  Neck: Normal range of motion. Neck supple. No JVD present.  Cardiovascular: Normal rate, regular rhythm and normal heart sounds.  Exam reveals no gallop and no friction rub.   No murmur heard. Pulmonary/Chest: Breath sounds normal. No respiratory distress. She has no wheezes. She has no rales.  Abdominal: Soft. Bowel sounds are normal. She exhibits no distension. There is no tenderness. There is no guarding.  Musculoskeletal: She exhibits no edema or tenderness.  Lymphadenopathy:    She has no cervical adenopathy.  Neurological: She is alert and oriented to person, place, and time. No cranial nerve deficit.  Skin: Skin is warm and dry. No rash noted. No erythema. No pallor.  Psychiatric: Affect and judgment normal.     LABORATORY DATA: I have personally reviewed the data as listed:  Admission on 09/14/2016, Discharged on 09/14/2016  Component Date Value Ref Range Status  . Glucose-Capillary 09/14/2016 166* 65 - 99 mg/dL Final  Admission on 09/09/2016, Discharged on 09/09/2016  Component Date Value Ref Range Status  . Glucose-Capillary 09/09/2016 93  65 - 99 mg/dL Final  . WBC 09/09/2016 8.3  4.0 - 10.5 K/uL Final  . RBC 09/09/2016 3.46* 3.87 - 5.11 MIL/uL Final  . Hemoglobin 09/09/2016 10.4* 12.0 - 15.0  g/dL Final  . HCT 09/09/2016 33.3* 36.0 - 46.0 % Final  . MCV 09/09/2016 96.2  78.0 - 100.0 fL Final  . MCH 09/09/2016 30.1  26.0 - 34.0 pg Final  . MCHC 09/09/2016 31.2  30.0 - 36.0 g/dL Final  . RDW 09/09/2016 16.4* 11.5 - 15.5 % Final  . Platelets 09/09/2016 349  150 - 400 K/uL Final  . Vitamin B-12 09/09/2016 3994* 180 - 914 pg/mL Final   Comment: (NOTE) This assay is not validated for testing neonatal or myeloproliferative syndrome specimens for Vitamin B12 levels. Performed at [facility], [place] [address] [place], [place] [address]   . Total Protein ELP 09/09/2016 6.0  6.0 - 8.5 g/dL Final  . Albumin ELP 09/09/2016 2.9  2.9 - 4.4 g/dL Final  .  Alpha-1-Globulin 09/09/2016 0.3  0.0 - 0.4 g/dL Final  . Alpha-2-Globulin 09/09/2016 1.1* 0.4 - 1.0 g/dL Final  . Beta Globulin 09/09/2016 1.0  0.7 - 1.3 g/dL Final  . Gamma Globulin 09/09/2016 0.8  0.4 - 1.8 g/dL Final  . M-Spike, % 09/09/2016 0.2* Not Observed g/dL Final  . SPE Interp. 09/09/2016 Comment   Final   Comment: (NOTE) Serum Protein Electrophoresis shows a faint band in the beta region which may represent a monoclonal gammopathy. Recommend serum and urine immunofixation electrophoresis to rule out a paraprotein indicative of a plasma cell dyscrasia, if clinically indicated. Performed At: [facility] [place], [place] [id number] Lindon Romp MD [id number]   . Comment 09/09/2016 Comment   Final   Comment: (NOTE) Protein electrophoresis scan will follow via computer, mail, or courier delivery.   Marland Kitchen GLOBULIN, TOTAL 09/09/2016 3.1  2.2 - 3.9 g/dL Corrected  . A/G Ratio 09/09/2016 0.9  0.7 - 1.7 Corrected  . Folate, Hemolysate 09/09/2016 >620.0  Not Estab. ng/mL Final  . Hematocrit 09/09/2016 32.2* 34.0 - 46.6 % Final  . Folate, RBC 09/09/2016 >1925  >498 ng/mL Final   Comment: (NOTE) Performed At: [facility] [place], [place] [id number] Lindon Romp MD TO:[id number]   Scanned Document on 09/01/2016  Component Date Value Ref Range Status  . IFOBT 08/04/2016 Positive   Final    RADIOGRAPHIC STUDIES: I have personally reviewed the radiological images as listed and agree with the findings in the report  No results found.  ASSESSMENT/PLAN 1. SPEP demonstrating  faint band in the beta region which may represent a monoclonal gammopathy - I will perform a full myeloma workup with labs as stated below, as well as a skeletal survey.   2. Anemia likely multifactorial from GI bleed from small bowel ulcer and also anemia of chronic renal disease. - Check CBC, iron studies, b12 level, erythropoeitin level - Continue  aranesp/procrit qmonthly through her nephrologist.  RTC in 3 weeks for follow up to review workup labs.  Orders Placed This Encounter  Procedures  . DG Bone Survey Met    Standing Status:   Future    Standing Expiration Date:   11/22/2017    Order Specific Question:   Reason for Exam (SYMPTOM  OR DIAGNOSIS REQUIRED)    Answer:   monoclonal gammapathy r/o lytic bone lesions    Order Specific Question:   Preferred imaging location?    Answer:   [facility]    Order Specific Question:   Radiology Contrast Protocol - do NOT remove file path    Answer:   [email or web address]  . CBC with Differential  Standing Status:   Future    Standing Expiration Date:   09/22/2017  . Comprehensive metabolic panel    Standing Status:   Future    Standing Expiration Date:   09/22/2017  . Multiple Myeloma Panel (SPEP&IFE w/QIG)    Standing Status:   Future    Standing Expiration Date:   09/22/2017  . Kappa/lambda light chains    Standing Status:   Future    Standing Expiration Date:   09/22/2017  . Beta 2 microglobuline, serum    Standing Status:   Future    Standing Expiration Date:   09/22/2017  . Protein electrophoresis, urine    Standing Status:   Future    Standing Expiration Date:   09/22/2017  . Erythropoietin    Standing Status:   Future    Standing Expiration Date:   09/22/2017  . Iron and TIBC    Standing Status:   Future    Standing Expiration Date:   09/22/2017  . Ferritin    Standing Status:   Future    Standing Expiration Date:   09/22/2017  . Vitamin B12    Standing Status:   Future    Standing Expiration Date:   09/22/2017    All questions were answered. The patient knows to call the clinic with any problems, questions or concerns.  This note was electronically signed.    Twana First, MD  09/22/2016 1:19 PM

## 2016-09-23 ENCOUNTER — Ambulatory Visit (HOSPITAL_COMMUNITY)
Admission: RE | Admit: 2016-09-23 | Payer: PRIVATE HEALTH INSURANCE | Source: Ambulatory Visit | Admitting: Gastroenterology

## 2016-09-23 ENCOUNTER — Encounter (HOSPITAL_COMMUNITY): Payer: Self-pay | Admitting: Gastroenterology

## 2016-09-23 LAB — KAPPA/LAMBDA LIGHT CHAINS
KAPPA, LAMDA LIGHT CHAIN RATIO: 1.42 (ref 0.26–1.65)
Kappa free light chain: 206.7 mg/L — ABNORMAL HIGH (ref 3.3–19.4)
LAMDA FREE LIGHT CHAINS: 146 mg/L — AB (ref 5.7–26.3)

## 2016-09-23 LAB — ERYTHROPOIETIN: ERYTHROPOIETIN: 347.2 m[IU]/mL — AB (ref 2.6–18.5)

## 2016-09-23 LAB — BETA 2 MICROGLOBULIN, SERUM: Beta-2 Microglobulin: 37.9 mg/L — ABNORMAL HIGH (ref 0.6–2.4)

## 2016-09-23 NOTE — Telephone Encounter (Signed)
OV made °

## 2016-09-24 LAB — MULTIPLE MYELOMA PANEL, SERUM
ALBUMIN/GLOB SERPL: 1 (ref 0.7–1.7)
ALPHA2 GLOB SERPL ELPH-MCNC: 1.2 g/dL — AB (ref 0.4–1.0)
Albumin SerPl Elph-Mcnc: 3 g/dL (ref 2.9–4.4)
Alpha 1: 0.3 g/dL (ref 0.0–0.4)
B-Globulin SerPl Elph-Mcnc: 0.9 g/dL (ref 0.7–1.3)
Gamma Glob SerPl Elph-Mcnc: 0.9 g/dL (ref 0.4–1.8)
Globulin, Total: 3.3 g/dL (ref 2.2–3.9)
IGG (IMMUNOGLOBIN G), SERUM: 974 mg/dL (ref 700–1600)
IGM, SERUM: 42 mg/dL (ref 26–217)
IgA: 287 mg/dL (ref 87–352)
TOTAL PROTEIN ELP: 6.3 g/dL (ref 6.0–8.5)

## 2016-09-27 NOTE — Progress Notes (Signed)
REVIEWED-NO ADDITIONAL RECOMMENDATIONS. 

## 2016-10-02 ENCOUNTER — Other Ambulatory Visit: Payer: Self-pay | Admitting: "Endocrinology

## 2016-10-02 LAB — COMPREHENSIVE METABOLIC PANEL
ALBUMIN: 3 g/dL — AB (ref 3.6–5.1)
ALK PHOS: 158 U/L — AB (ref 33–130)
ALT: 9 U/L (ref 6–29)
AST: 19 U/L (ref 10–35)
BILIRUBIN TOTAL: 0.4 mg/dL (ref 0.2–1.2)
BUN: 48 mg/dL — ABNORMAL HIGH (ref 7–25)
CALCIUM: 8.9 mg/dL (ref 8.6–10.4)
CO2: 25 mmol/L (ref 20–32)
CREATININE: 14.29 mg/dL — AB (ref 0.50–0.99)
Chloride: 94 mmol/L — ABNORMAL LOW (ref 98–110)
Glucose, Bld: 97 mg/dL (ref 65–99)
Potassium: 4.2 mmol/L (ref 3.5–5.3)
SODIUM: 137 mmol/L (ref 135–146)
Total Protein: 5.9 g/dL — ABNORMAL LOW (ref 6.1–8.1)

## 2016-10-03 LAB — HEMOGLOBIN A1C
Hgb A1c MFr Bld: 6.2 % — ABNORMAL HIGH (ref <5.7)
MEAN PLASMA GLUCOSE: 131 mg/dL

## 2016-10-09 ENCOUNTER — Ambulatory Visit (INDEPENDENT_AMBULATORY_CARE_PROVIDER_SITE_OTHER): Payer: PRIVATE HEALTH INSURANCE | Admitting: "Endocrinology

## 2016-10-09 ENCOUNTER — Encounter: Payer: Self-pay | Admitting: "Endocrinology

## 2016-10-09 VITALS — BP 127/68 | HR 80 | Ht 63.25 in | Wt 172.0 lb

## 2016-10-09 DIAGNOSIS — I10 Essential (primary) hypertension: Secondary | ICD-10-CM | POA: Diagnosis not present

## 2016-10-09 DIAGNOSIS — E1122 Type 2 diabetes mellitus with diabetic chronic kidney disease: Secondary | ICD-10-CM | POA: Diagnosis not present

## 2016-10-09 DIAGNOSIS — E782 Mixed hyperlipidemia: Secondary | ICD-10-CM

## 2016-10-09 DIAGNOSIS — N186 End stage renal disease: Secondary | ICD-10-CM | POA: Diagnosis not present

## 2016-10-09 MED ORDER — INSULIN DEGLUDEC 100 UNIT/ML ~~LOC~~ SOPN: 40.0000 [IU] | PEN_INJECTOR | Freq: Every day | SUBCUTANEOUS | 2 refills | Status: DC

## 2016-10-09 NOTE — Progress Notes (Signed)
Subjective:    Patient ID: Selena Rowe, female    DOB: 1955/12/02,    Past Medical History:  Diagnosis Date  . Diabetes mellitus, type II ([place])   . Hyperlipidemia   . Hypertension   . Kidney disease    on diaylsis since 2016  . Stroke [facility]) 2014   Past Surgical History:  Procedure Laterality Date  . ABDOMINAL HYSTERECTOMY    . AGILE CAPSULE N/A 09/14/2016   Procedure: AGILE CAPSULE;  Surgeon: Danie Binder, MD;  [facility];  Service: Endoscopy;  Laterality: N/A;  . BIOPSY  09/09/2016   Procedure: BIOPSY;  Surgeon: Danie Binder, MD;  [facility];  Service: Endoscopy;;  duodenal;gastric  . CESAREAN SECTION    . COLONOSCOPY  09/2013   Dr. Earley Brooke: Performed for IDA due to blood loss, normal colonoscopy  . COLONOSCOPY  12/2005   Dr. Earley Brooke: hemorrhoids  . COLONOSCOPY N/A 09/09/2016   Procedure: COLONOSCOPY;  Surgeon: Danie Binder, MD;  [facility];  Service: Endoscopy;  Laterality: N/A;  1015  . DIALYSIS FISTULA CREATION    . ESOPHAGOGASTRODUODENOSCOPY  02/2014   Dr. Earley Brooke: Hiatal hernia, few erosions of the antrum status post biopsy for clotest, small bowel biopsy showed mild chronic inflammation of the duodenum with changes of celiac disease not seen  . ESOPHAGOGASTRODUODENOSCOPY  12/2005   Dr. Alethia Berthold, Hiatal hernia  . ESOPHAGOGASTRODUODENOSCOPY N/A 09/09/2016   Procedure: ESOPHAGOGASTRODUODENOSCOPY (EGD);  Surgeon: Danie Binder, MD;  [facility];  Service: Endoscopy;  Laterality: N/A;  . GIVENS CAPSULE STUDY N/A 09/21/2016   Procedure: GIVENS CAPSULE STUDY;  Surgeon: Danie Binder, MD;  [facility];  Service: Endoscopy;  Laterality: N/A;  7:30am, pt to arrive at 7:00am  . POLYPECTOMY  09/09/2016   Procedure: POLYPECTOMY;  Surgeon: Danie Binder, MD;  [facility];  Service: Endoscopy;;  cecal and hepatic flexure polyp  . TUBAL LIGATION     Social History   Social History  . Marital  status: Married    Spouse name: N/A  . Number of children: N/A  . Years of education: N/A   Social History Main Topics  . Smoking status: Never Smoker  . Smokeless tobacco: Never Used  . Alcohol use No  . Drug use: No  . Sexual activity: Not on file   Other Topics Concern  . Not on file   Social History Narrative  . No narrative on file   Outpatient Encounter Prescriptions as of 10/09/2016  Medication Sig  . acetaminophen (TYLENOL) 500 MG tablet Take 1,000-1,500 mg by mouth at bedtime as needed for moderate pain or headache.  Marland Kitchen amoxicillin (AMOXIL) 500 MG capsule Take 2,000 mg by mouth as directed. 1 hour prior to procedures  . atorvastatin (LIPITOR) 40 MG tablet Take 40 mg by mouth every evening.   . calcitRIOL (ROCALTROL) 0.25 MCG capsule Take 0.25 mcg by mouth every Monday, Wednesday, and Friday.   . carvedilol (COREG) 25 MG tablet Take 25 mg by mouth 2 (two) times daily.  . clopidogrel (PLAVIX) 75 MG tablet Take 75 mg by mouth daily.  . Cyanocobalamin (B-12) 3000 MCG CAPS Take 3,000 mcg by mouth daily.  Marland Kitchen doxazosin (CARDURA) 1 MG tablet Take 1 mg by mouth at bedtime.   . ergocalciferol (VITAMIN D2) 50000 UNITS capsule Take 50,000 Units by mouth every 30 (thirty) days.   . furosemide (LASIX) 40 MG tablet Take 40 mg by  mouth 2 (two) times daily.  Marland Kitchen gabapentin (NEURONTIN) 100 MG capsule Take 100 mg by mouth daily as needed (pain).   . hydrALAZINE (APRESOLINE) 50 MG tablet Take 50 mg by mouth 2 (two) times daily.  . insulin degludec (TRESIBA FLEXTOUCH) 100 UNIT/ML SOPN FlexTouch Pen Inject 0.4 mLs (40 Units total) into the skin daily at 10 pm.  . insulin lispro (HUMALOG KWIKPEN) 100 UNIT/ML KiwkPen Inject 10-15 Units into the skin 3 (three) times daily as needed (high blood sugar). Per sliding scale  . multivitamin (RENA-VIT) TABS tablet Take 1 tablet by mouth daily.  Marland Kitchen nystatin (MYCOSTATIN) 100000 UNIT/ML suspension TAKE 1 TEASPOONFUL (5ML)PO 3 TIMES A DAY FOR 14 DAYS - SWISH IN  MOUTH AND SWALLOW  . sucroferric oxyhydroxide (VELPHORO) 500 MG chewable tablet Chew 500 mg by mouth 3 (three) times daily with meals.  . [DISCONTINUED] bismuth-metronidazole-tetracycline (PYLERA) 140-125-125 MG capsule 3 PO QID FOR 10 DAYS  . [DISCONTINUED] cinacalcet (SENSIPAR) 30 MG tablet Take 30 mg by mouth. Every other day with evening meal  . [DISCONTINUED] Insulin Detemir (LEVEMIR FLEXTOUCH) 100 UNIT/ML Pen Inject 40 Units into the skin daily at 10 pm.   No facility-administered encounter medications on file as of 10/09/2016.    ALLERGIES: Allergies  Allergen Reactions  . Amlodipine Swelling   VACCINATION STATUS:  There is no immunization history on file for this patient.  Diabetes  She presents for her follow-up diabetic visit. She has type 2 diabetes mellitus. Onset time: She was diagnosed at approximate age of 19 years preceded by history of gestational diabetes in 9. Her disease course has been improving. There are no hypoglycemic associated symptoms. Pertinent negatives for hypoglycemia include no confusion, headaches, pallor or seizures. (She had one episode of hypoglycemia to 40 associated with symptoms including confusion.) Associated symptoms include fatigue and polydipsia. Pertinent negatives for diabetes include no chest pain and no polyphagia. There are no hypoglycemic complications. Symptoms are improving. Diabetic complications include a CVA, nephropathy and retinopathy. Risk factors for coronary artery disease include dyslipidemia, diabetes mellitus, hypertension and sedentary lifestyle. Current diabetic treatment includes insulin injections. She is compliant with treatment some of the time. Her weight is stable. She is following a generally unhealthy diet. She has had a previous visit with a dietitian. She rarely participates in exercise. Home blood sugar record trend: she did not monitor enough. Her breakfast blood glucose range is generally 140-180 mg/dl. Her bedtime  blood glucose range is generally 180-200 mg/dl. Her overall blood glucose range is 140-180 mg/dl. An ACE inhibitor/angiotensin II receptor blocker is being taken. Eye exam is current.  Hypertension  This is a chronic problem. The current episode started more than 1 year ago. The problem is uncontrolled (Patient states she did not take her blood pressure medications this morning.). Pertinent negatives include no chest pain, headaches, palpitations or shortness of breath. Risk factors for coronary artery disease include dyslipidemia, diabetes mellitus and sedentary lifestyle. Past treatments include beta blockers, diuretics and ACE inhibitors. Hypertensive end-organ damage includes CVA and retinopathy.  Hyperlipidemia  This is a chronic problem. The current episode started more than 1 year ago. The problem is uncontrolled. Pertinent negatives include no chest pain, myalgias or shortness of breath. Current antihyperlipidemic treatment includes statins. Risk factors for coronary artery disease include diabetes mellitus, dyslipidemia, hypertension and a sedentary lifestyle.     Review of Systems  Constitutional: Positive for fatigue. Negative for unexpected weight change.  HENT: Negative for trouble swallowing and voice change.   Eyes:  Positive for visual disturbance.  Respiratory: Negative for cough, shortness of breath and wheezing.   Cardiovascular: Negative for chest pain, palpitations and leg swelling.  Gastrointestinal: Negative for diarrhea, nausea and vomiting.  Endocrine: Positive for polydipsia. Negative for cold intolerance, heat intolerance and polyphagia.  Musculoskeletal: Negative for arthralgias and myalgias.  Skin: Negative for color change, pallor, rash and wound.  Neurological: Negative for seizures and headaches.  Psychiatric/Behavioral: Negative for confusion and suicidal ideas.    Objective:    BP 127/68   Pulse 80   Ht 5' 3.25" (1.607 m)   Wt 172 lb (78 kg)   BMI 30.23  kg/m   Wt Readings from Last 3 Encounters:  10/09/16 172 lb (78 kg)  09/22/16 172 lb (78 kg)  09/21/16 174 lb (78.9 kg)    Physical Exam  Constitutional: She is oriented to person, place, and time. She appears well-developed.  HENT:  Head: Normocephalic and atraumatic.  Eyes: EOM are normal.  Neck: Normal range of motion. Neck supple. No tracheal deviation present. No thyromegaly present.  Cardiovascular: Normal rate and regular rhythm.   Pulmonary/Chest: Effort normal and breath sounds normal.  Abdominal: Soft. Bowel sounds are normal. There is no tenderness. There is no guarding.  Musculoskeletal: Normal range of motion. She exhibits no edema.  Neurological: She is alert and oriented to person, place, and time. She has normal reflexes. No cranial nerve deficit. Coordination normal.  Skin: Skin is warm and dry. No rash noted. No erythema. No pallor.  Psychiatric: She has a normal mood and affect. Judgment normal.    Recent Results (from the past 2160 hour(s))  IFOBT POC (occult bld, rslt in office)     Status: None   Collection Time: 08/04/16  4:06 PM  Result Value Ref Range   IFOBT Positive   Hemoglobin and Hematocrit, Blood     Status: Abnormal   Collection Time: 08/07/16 10:56 AM  Result Value Ref Range   Hemoglobin 8.5 (L) 11.7 - 15.5 g/dL   HCT 26.9 (L) 35.0 - 45.0 %  Hemoglobin and hematocrit, blood     Status: Abnormal   Collection Time: 08/24/16  8:27 AM  Result Value Ref Range   Hemoglobin 9.8 (L) 11.7 - 15.5 g/dL   HCT 31.2 (L) 35.0 - 45.0 %  Glucose, capillary     Status: None   Collection Time: 09/09/16  9:53 AM  Result Value Ref Range   Glucose-Capillary 93 65 - 99 mg/dL  CBC     Status: Abnormal   Collection Time: 09/09/16  1:00 PM  Result Value Ref Range   WBC 8.3 4.0 - 10.5 K/uL   RBC 3.46 (L) 3.87 - 5.11 MIL/uL   Hemoglobin 10.4 (L) 12.0 - 15.0 g/dL   HCT 33.3 (L) 36.0 - 46.0 %   MCV 96.2 78.0 - 100.0 fL   MCH 30.1 26.0 - 34.0 pg   MCHC 31.2 30.0 -  36.0 g/dL   RDW 16.4 (H) 11.5 - 15.5 %   Platelets 349 150 - 400 K/uL  Vitamin B12     Status: Abnormal   Collection Time: 09/09/16  1:00 PM  Result Value Ref Range   Vitamin B-12 3,994 (H) 180 - 914 pg/mL    Comment: (NOTE) This assay is not validated for testing neonatal or myeloproliferative syndrome specimens for Vitamin B12 levels. Performed at [facility], [place] [address], [place], [place] [address]   Protein electrophoresis, serum     Status: Abnormal  Collection Time: 09/09/16  1:00 PM  Result Value Ref Range   Total Protein ELP 6.0 6.0 - 8.5 g/dL   Albumin ELP 2.9 2.9 - 4.4 g/dL   Alpha-1-Globulin 0.3 0.0 - 0.4 g/dL   Alpha-2-Globulin 1.1 (H) 0.4 - 1.0 g/dL   Beta Globulin 1.0 0.7 - 1.3 g/dL   Gamma Globulin 0.8 0.4 - 1.8 g/dL   M-Spike, % 0.2 (H) Not Observed g/dL   SPE Interp. Comment     Comment: (NOTE) Serum Protein Electrophoresis shows a faint band in the beta region which may represent a monoclonal gammopathy. Recommend serum and urine immunofixation electrophoresis to rule out a paraprotein indicative of a plasma cell dyscrasia, if clinically indicated. Performed At: [facility] [place], [place] [id number] Lindon Romp MD [id number]    Comment Comment     Comment: (NOTE) Protein electrophoresis scan will follow via computer, mail, or courier delivery.    GLOBULIN, TOTAL 3.1 2.2 - 3.9 g/dL   A/G Ratio 0.9 0.7 - 1.7  Folate RBC     Status: Abnormal   Collection Time: 09/09/16  1:00 PM  Result Value Ref Range   Folate, Hemolysate >620.0 Not Estab. ng/mL   Hematocrit 32.2 (L) 34.0 - 46.6 %   Folate, RBC >1,925 >498 ng/mL    Comment: (NOTE) Performed At: [facility] [place], [place] [id number] Lindon Romp MD [id number]   Glucose, capillary     Status: Abnormal   Collection Time: 09/14/16  7:53 AM  Result Value Ref Range   Glucose-Capillary 166 (H) 65 - 99 mg/dL  CBC with  Differential     Status: Abnormal   Collection Time: 09/22/16  2:13 PM  Result Value Ref Range   WBC 10.8 (H) 4.0 - 10.5 K/uL   RBC 3.60 (L) 3.87 - 5.11 MIL/uL   Hemoglobin 10.5 (L) 12.0 - 15.0 g/dL   HCT 33.5 (L) 36.0 - 46.0 %   MCV 93.1 78.0 - 100.0 fL   MCH 29.2 26.0 - 34.0 pg   MCHC 31.3 30.0 - 36.0 g/dL   RDW 16.6 (H) 11.5 - 15.5 %   Platelets 388 150 - 400 K/uL    Comment: SPECIMEN CHECKED FOR CLOTS PLATELET COUNT CONFIRMED BY SMEAR    Neutrophils Relative % 56 %   Neutro Abs 6.1 1.7 - 7.7 K/uL   Lymphocytes Relative 32 %   Lymphs Abs 3.5 0.7 - 4.0 K/uL   Monocytes Relative 8 %   Monocytes Absolute 0.9 0.1 - 1.0 K/uL   Eosinophils Relative 3 %   Eosinophils Absolute 0.3 0.0 - 0.7 K/uL   Basophils Relative 1 %   Basophils Absolute 0.1 0.0 - 0.1 K/uL   RBC Morphology POLYCHROMASIA PRESENT   Comprehensive metabolic panel     Status: Abnormal   Collection Time: 09/22/16  2:13 PM  Result Value Ref Range   Sodium 136 135 - 145 mmol/L   Potassium 3.9 3.5 - 5.1 mmol/L   Chloride 94 (L) 101 - 111 mmol/L   CO2 26 22 - 32 mmol/L   Glucose, Bld 126 (H) 65 - 99 mg/dL   BUN 46 (H) 6 - 20 mg/dL   Creatinine, Ser 14.29 (H) 0.44 - 1.00 mg/dL   Calcium 9.4 8.9 - 10.3 mg/dL   Total Protein 7.1 6.5 - 8.1 g/dL   Albumin 2.8 (L) 3.5 - 5.0 g/dL   AST 17 15 - 41 U/L   ALT 5 (L) 14 -  54 U/L   Alkaline Phosphatase 125 38 - 126 U/L   Total Bilirubin 0.6 0.3 - 1.2 mg/dL   GFR calc non Af Amer 2 (L) >60 mL/min   GFR calc Af Amer 3 (L) >60 mL/min    Comment: (NOTE) The eGFR has been calculated using the CKD EPI equation. This calculation has not been validated in all clinical situations. eGFR's persistently <60 mL/min signify possible Chronic Kidney Disease.    Anion gap 16 (H) 5 - 15  Kappa/lambda light chains     Status: Abnormal   Collection Time: 09/22/16  2:13 PM  Result Value Ref Range   Kappa free light chain 206.7 (H) 3.3 - 19.4 mg/L   Lamda free light chains 146.0 (H) 5.7 -  26.3 mg/L   Kappa, lamda light chain ratio 1.42 0.26 - 1.65    Comment: (NOTE) Performed At: [facility] [place], [place] [id number] Lindon Romp MD [id number]   Beta 2 microglobuline, serum     Status: Abnormal   Collection Time: 09/22/16  2:13 PM  Result Value Ref Range   Beta-2 Microglobulin 37.9 (H) 0.6 - 2.4 mg/L    Comment: (NOTE) Siemens Immulite 2000 Immunochemiluminometric assay [facility]) **Results verified by repeat testing** Performed At: [facility] [place], [place] [id number] Lindon Romp MD [id number]   Erythropoietin     Status: Abnormal   Collection Time: 09/22/16  2:13 PM  Result Value Ref Range   Erythropoietin 347.2 (H) 2.6 - 18.5 mIU/mL    Comment: (NOTE) [employer] Coul[employer] UniCel DxI 800 Immunoassay System Performed At: [facility] [place], [place] [id number] Lindon Romp MD [id number]   Multiple Myeloma Panel (SPEP&IFE w/QIG)     Status: Abnormal   Collection Time: 09/22/16  2:15 PM  Result Value Ref Range   IgG (Immunoglobin G), Serum 974 700 - 1,600 mg/dL   IgA 287 87 - 352 mg/dL   IgM, Serum 42 26 - 217 mg/dL   Total Protein ELP 6.3 6.0 - 8.5 g/dL   Albumin SerPl Elph-Mcnc 3.0 2.9 - 4.4 g/dL   Alpha 1 0.3 0.0 - 0.4 g/dL   Alpha2 Glob SerPl Elph-Mcnc 1.2 (H) 0.4 - 1.0 g/dL   B-Globulin SerPl Elph-Mcnc 0.9 0.7 - 1.3 g/dL   Gamma Glob SerPl Elph-Mcnc 0.9 0.4 - 1.8 g/dL   M Protein SerPl Elph-Mcnc Not Observed Not Observed g/dL   Globulin, Total 3.3 2.2 - 3.9 g/dL   Albumin/Glob SerPl 1.0 0.7 - 1.7   IFE 1 Comment     Comment: An apparent normal immunofixation pattern.   Please Note Comment     Comment: (NOTE) Protein electrophoresis scan will follow via computer, mail, or courier delivery. Performed At: [facility] [place], [place] [id number] Lindon Romp MD [id number]   Iron and TIBC     Status: Abnormal   Collection Time:  09/22/16  2:15 PM  Result Value Ref Range   Iron 76 28 - 170 ug/dL   TIBC 231 (L) 250 - 450 ug/dL   Saturation Ratios 33 (H) 10.4 - 31.8 %   UIBC 155 ug/dL    Comment: Performed at [facility], [place] [address], [place], [place] [address]  Ferritin     Status: Abnormal   Collection Time: 09/22/16  2:15 PM  Result Value Ref Range   Ferritin 1,285 (H) 11 - 307 ng/mL    Comment: Performed at [facility], [place] [address] [place], [place] [address]  Vitamin B12     Status: Abnormal   Collection Time: 09/22/16  2:15 PM  Result Value Ref Range   Vitamin B-12 6,564 (H) 180 - 914 pg/mL    Comment: (NOTE) This assay is not validated for testing neonatal or myeloproliferative syndrome specimens for Vitamin B12 levels. Performed at [facility], [place] [address] [place], [place] [address]   Comprehensive metabolic panel     Status: Abnormal   Collection Time: 10/02/16  8:32 AM  Result Value Ref Range   Sodium 137 135 - 146 mmol/L   Potassium 4.2 3.5 - 5.3 mmol/L   Chloride 94 (L) 98 - 110 mmol/L   CO2 25 20 - 32 mmol/L    Comment: ** Please note change in reference range(s). **      Glucose, Bld 97 65 - 99 mg/dL   BUN 48 (H) 7 - 25 mg/dL   Creat 14.29 (H) 0.50 - 0.99 mg/dL    Comment: Result repeated and verified.   For patients > or = 63 years of age: The upper reference limit for Creatinine is approximately 13% higher for people identified as African-American.      Total Bilirubin 0.4 0.2 - 1.2 mg/dL   Alkaline Phosphatase 158 (H) 33 - 130 U/L   AST 19 10 - 35 U/L   ALT 9 6 - 29 U/L   Total Protein 5.9 (L) 6.1 - 8.1 g/dL   Albumin 3.0 (L) 3.6 - 5.1 g/dL   Calcium 8.9 8.6 - 10.4 mg/dL  Hemoglobin A1c     Status: Abnormal   Collection Time: 10/02/16  8:32 AM  Result Value Ref Range   Hgb A1c MFr Bld 6.2 (H) <5.7 %    Comment:   For someone without known diabetes, a hemoglobin A1c value between 5.7% and 6.4% is consistent with prediabetes and should be  confirmed with a follow-up test.   For someone with known diabetes, a value <7% indicates that their diabetes is well controlled. A1c targets should be individualized based on duration of diabetes, age, co-morbid conditions and other considerations.   This assay result is consistent with an increased risk of diabetes.   Currently, no consensus exists regarding use of hemoglobin A1c for diagnosis of diabetes in children.      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 - Patient has currently uncontrolled symptomatic type 2 DM since  61 years of age,  with most recent A1c Improving to 6.2%, generally improving from 12.6%. - She eats only one proper meal daily which his breakfast and it's only salads at lunch and light dinner. She uses Humalog only with breakfast.   Her diabetes is complicated by end-stage renal disease on dialysis , retinopathy, and cerebrovascular accident and patient remains at a high risk for more acute and chronic complications of diabetes which include CAD, CVA, CKD, retinopathy, and neuropathy. These are all discussed in detail with the patient.  - I have counseled the patient on diet management and weight loss, by adopting a carbohydrate restricted/protein rich diet.  Suggestion is made for her to avoid simple carbohydrates  from her diet including Cakes, Sweet Desserts, Ice Cream, Soda (diet and regular), Sweet Tea, Candies, Chips, Cookies, Store Bought Juices, Alcohol in Excess of  1-2 drinks a day, Artificial  Sweeteners, and "Sugar-free" Products. This will help patient to have stable blood glucose profile and potentially avoid unintended weight gain.   - I encouraged the patient to switch to  unprocessed or minimally processed complex starch and increased protein intake (animal or plant source), fruits, and  vegetables.  - Patient is advised to stick to a routine mealtimes to eat 3 meals  a day and avoid unnecessary snacks ( to snack only to correct hypoglycemia).    - I have approached patient with the following individualized plan to manage diabetes and patient agrees:   - Emphasizing the need for strict compliance, I  will continue Levemir to 40 units daily at bedtime ( she is interested to switch her Levemir toTresiba - Will will continue at same dose of 40 units daily at bedtime after she finishes her Levemir) , continue Humalog  10 units TIDAC for pre-meal BG readings of 90-159m/dl, plus patient specific correction dose for unexpected hyperglycemia above 1583mdl, associated with strict monitoring of glucose  daily before meals and at bedtime. - Patient is warned not to take insulin without proper monitoring per orders. -Adjustment parameters are given for hypo and hyperglycemia in writing. -Patient is encouraged to call clinic for blood glucose levels less than 70 or above 300 mg /dl. -If she cannot afford insulin analogs, she will be considered for premixed Novolin 70/30 to use twice a day.   -Patient is not a candidate for metformin andSGLT2 inhibitors due to CKD.  - Patient will be considered for incretin therapy as appropriate next visit. - Patient specific target  A1c;  LDL, HDL, Triglycerides, and  Waist Circumference were discussed in detail.  2) BP/HTN: controlled . I advised her to continue carvedilol 12.5 mg by mouth twice a day lisinopril 10 mg by mouth daily. She is also following with nephrology.  3) Lipids/HPL:  Controlled , LDL at 55.  continue statins. 4)  Weight/Diet: CDE Consult has been  initiated , exercise, and detailed carbohydrates information provided.  5) Chronic Care/Health Maintenance:  -Patient is on ACEI/ARB and Statin medications and encouraged to continue to follow up with Ophthalmology, Podiatrist at least yearly or according to recommendations, and advised  to   stay away from smoking. I have recommended yearly flu vaccine and pneumonia vaccination at least every 5 years; moderate intensity exercise for up to 150 minutes weekly; and  sleep for at least 7 hours a day.   Patient to bring meter and  blood glucose logs during her next visit. - Time spent with the patient: 25 min, of which >50% was spent in reviewing her sugar logs , discussing her hypo- and hyper-glycemic episodes, reviewing  previous labs and insulin doses and developing a plan to avoid hypo- and hyper-glycemia.    I advised patient to maintain close follow up with their PCP for primary care needs. Follow up plan: Return in about 3 months (around 01/09/2017) for follow up with pre-visit labs, meter, and logs.  GeGlade LloydMD Phone: [phone]: [phone] 10/09/2016, 12:05 PM

## 2016-10-09 NOTE — Patient Instructions (Signed)

## 2016-10-14 ENCOUNTER — Ambulatory Visit (HOSPITAL_COMMUNITY): Payer: Medicare Other

## 2016-10-14 ENCOUNTER — Encounter (HOSPITAL_COMMUNITY): Payer: Medicare Other | Attending: Oncology | Admitting: Oncology

## 2016-10-14 VITALS — BP 128/52 | HR 85 | Resp 18 | Ht 64.0 in | Wt 171.0 lb

## 2016-10-14 DIAGNOSIS — D472 Monoclonal gammopathy: Secondary | ICD-10-CM

## 2016-10-14 DIAGNOSIS — D631 Anemia in chronic kidney disease: Secondary | ICD-10-CM | POA: Diagnosis not present

## 2016-10-14 DIAGNOSIS — Z992 Dependence on renal dialysis: Secondary | ICD-10-CM | POA: Diagnosis not present

## 2016-10-14 DIAGNOSIS — N186 End stage renal disease: Secondary | ICD-10-CM | POA: Diagnosis not present

## 2016-10-14 DIAGNOSIS — I10 Essential (primary) hypertension: Secondary | ICD-10-CM | POA: Diagnosis not present

## 2016-10-14 NOTE — Progress Notes (Signed)
[facility] Initial Visit:  Patient Care Team: Dhivianathan, Candida Peeling, MD as PCP - General (Family Medicine)  CHIEF COMPLAINTS/PURPOSE OF CONSULTATION:  Monoclonal gammopathy Anemia  HISTORY OF PRESENTING ILLNESS: Selena Rowe 61 y.o. 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 A few weeks ago patient was found to have a hemoglobin of 6 g/dL an outside hospital. She recently underwent EGD/colonoscopy on 09/09/16 by Dr. Oneida Alar. EGD demonstrated moderate erosive gastritis and duodenitis. Colonoscopy demonstrated no source for anemia, two 2 to 4 mm polyps at the hepatic flexure and in the cecum were removed, diverticulosis in the sigmoid colon, descending colon, transverse colon, as well as internal hemorrhoids. Stomach biopsy demonstrated a mild chronic gastritis, rare H. pylori present, no intestinal metaplasia identified. While none biopsy demonstrated benign duodenal mucosa. Colon polyps demonstrated tubular adenoma, no high-grade dysplasia or malignancy identified. Givens capsule demonstrated a small bowel ulcer. An SPEP was checked on 09/09/16 as part of her anemia workup demonstrated a faint band in the beta region which may represent a monoclonal gammopathy. No immunofixation was performed. Her most recent CBC from 09/09/16 demonstrated hemoglobin 10.4 g/dL, hematocrit 33.3%.  Patient has ESRD and does home peritoneal dialysis daily. Patient recently had a left arm fistula placed a few weeks ago however she does not know when the plan would be for her to start hemodialysis. She denies any bone pain she denies any GI bleeding to her knowledge. She denies any melena or hematochezia. She states her stools always look dark, like spinach, due to the volphoro that she takes. She is also on Aranesp or Procrit through her nephrologist monthly.  INTERVAL HISTORY: Patient presents today for follow-up a monoclonal gammopathy. Repeat lab work performed on 09/22/16  demonstrated normal SPEP and IFE without any evidence of monoclonal paraprotein. Kappa free light chain 206.7, lambda free light chain 146, kappa/lambda light chain ratio 1.42. Beta 2 microglobulin 37.9. Erythropoietin level elevated at 347.2. B12 and ferritin were both elevated. CBC demonstrated WBC 10.8 K, hemoglobin 10.5 g/dL, hematocrit 33.5%, plate count 073X. Today patient states that she feels well. She has no complaints. She denies any chest pain, shortness breath, abdominal pain, nausea, vomiting, diarrhea.   Review of Systems  Constitutional: Negative for appetite change, chills, fatigue and fever.  HENT:   Negative for hearing loss, lump/mass, mouth sores, sore throat and tinnitus.   Eyes: Negative for eye problems and icterus.  Respiratory: Negative for chest tightness, cough, hemoptysis, shortness of breath and wheezing.   Cardiovascular: Negative for chest pain, leg swelling and palpitations.  Gastrointestinal: Negative for abdominal distention, abdominal pain, blood in stool, diarrhea, nausea and vomiting.  Endocrine: Negative.  Negative for hot flashes.  Genitourinary: Negative for difficulty urinating, frequency and hematuria.   Musculoskeletal: Negative for arthralgias and neck pain.  Skin: Negative for itching and rash.  Neurological: Negative for dizziness, headaches and speech difficulty.  Hematological: Negative for adenopathy. Does not bruise/bleed easily.  Psychiatric/Behavioral: Negative for confusion. The patient is not nervous/anxious.     MEDICAL HISTORY: Past Medical History:  Diagnosis Date  . Diabetes mellitus, type II ([place])   . Hyperlipidemia   . Hypertension   . Kidney disease    on diaylsis since 2016  . Stroke [facility]) 2014    SURGICAL HISTORY: Past Surgical History:  Procedure Laterality Date  . ABDOMINAL HYSTERECTOMY    . AGILE CAPSULE N/A 09/14/2016   Procedure: AGILE CAPSULE;  Surgeon: Danie Binder, MD;  Location[facility];  Service:  Endoscopy;  Laterality: N/A;  . BIOPSY  09/09/2016   Procedure: BIOPSY;  Surgeon: Danie Binder, MD;  Location: [facility];  Service: Endoscopy;;  duodenal;gastric  . CESAREAN SECTION    . COLONOSCOPY  09/2013   Dr. Earley Brooke: Performed for IDA due to blood loss, normal colonoscopy  . COLONOSCOPY  12/2005   Dr. Earley Brooke: hemorrhoids  . COLONOSCOPY N/A 09/09/2016   Procedure: COLONOSCOPY;  Surgeon: Danie Binder, MD;  Location[facility];  Service: Endoscopy;  Laterality: N/A;  1015  . DIALYSIS FISTULA CREATION    . ESOPHAGOGASTRODUODENOSCOPY  02/2014   Dr. Earley Brooke: Hiatal hernia, few erosions of the antrum status post biopsy for clotest, small bowel biopsy showed mild chronic inflammation of the duodenum with changes of celiac disease not seen  . ESOPHAGOGASTRODUODENOSCOPY  12/2005   Dr. Alethia Berthold, Hiatal hernia  . ESOPHAGOGASTRODUODENOSCOPY N/A 09/09/2016   Procedure: ESOPHAGOGASTRODUODENOSCOPY (EGD);  Surgeon: Danie Binder, MD;  [facility];  Service: Endoscopy;  Laterality: N/A;  . GIVENS CAPSULE STUDY N/A 09/21/2016   Procedure: GIVENS CAPSULE STUDY;  Surgeon: Danie Binder, MD;  [facility];  Service: Endoscopy;  Laterality: N/A;  7:30am, pt to arrive at 7:00am  . POLYPECTOMY  09/09/2016   Procedure: POLYPECTOMY;  Surgeon: Danie Binder, MD;  [facility];  Service: Endoscopy;;  cecal and hepatic flexure polyp  . TUBAL LIGATION      SOCIAL HISTORY: Social History   Social History  . Marital status: Married    Spouse name: N/A  . Number of children: N/A  . Years of education: N/A   Occupational History  . Not on file.   Social History Main Topics  . Smoking status: Never Smoker  . Smokeless tobacco: Never Used  . Alcohol use No  . Drug use: No  . Sexual activity: Not on file   Other Topics Concern  . Not on file   Social History Narrative  . No narrative on file    FAMILY HISTORY Family History  Problem Relation Age of  Onset  . Hypertension Mother   . Kidney disease Sister   . Lung cancer Sister   . Colon cancer Neg Hx     ALLERGIES:  is allergic to amlodipine.  MEDICATIONS:  Current Outpatient Prescriptions  Medication Sig Dispense Refill  . acetaminophen (TYLENOL) 500 MG tablet Take 1,000-1,500 mg by mouth at bedtime as needed for moderate pain or headache.    Marland Kitchen amoxicillin (AMOXIL) 500 MG capsule Take 2,000 mg by mouth as directed. 1 hour prior to procedures    . atorvastatin (LIPITOR) 40 MG tablet Take 40 mg by mouth every evening.     . calcitRIOL (ROCALTROL) 0.25 MCG capsule Take 0.25 mcg by mouth every Monday, Wednesday, and Friday.     . carvedilol (COREG) 25 MG tablet Take 25 mg by mouth 2 (two) times daily.    . clopidogrel (PLAVIX) 75 MG tablet Take 75 mg by mouth daily.    . Cyanocobalamin (B-12) 3000 MCG CAPS Take 3,000 mcg by mouth daily.    Marland Kitchen doxazosin (CARDURA) 1 MG tablet Take 1 mg by mouth at bedtime.     . ergocalciferol (VITAMIN D2) 50000 UNITS capsule Take 50,000 Units by mouth every 30 (thirty) days.     . furosemide (LASIX) 40 MG tablet Take 40 mg by mouth 2 (two) times daily.    Marland Kitchen gabapentin (NEURONTIN) 100 MG capsule Take 100  mg by mouth daily as needed (pain).     . hydrALAZINE (APRESOLINE) 50 MG tablet Take 50 mg by mouth 2 (two) times daily.    . insulin degludec (TRESIBA FLEXTOUCH) 100 UNIT/ML SOPN FlexTouch Pen Inject 0.4 mLs (40 Units total) into the skin daily at 10 pm. 5 pen 2  . insulin lispro (HUMALOG KWIKPEN) 100 UNIT/ML KiwkPen Inject 10-15 Units into the skin 3 (three) times daily as needed (high blood sugar). Per sliding scale    . multivitamin (RENA-VIT) TABS tablet Take 1 tablet by mouth daily.    Marland Kitchen nystatin (MYCOSTATIN) 100000 UNIT/ML suspension TAKE 1 TEASPOONFUL (5ML)PO 3 TIMES A DAY FOR 14 DAYS - SWISH IN MOUTH AND SWALLOW  0  . sucroferric oxyhydroxide (VELPHORO) 500 MG chewable tablet Chew 500 mg by mouth 3 (three) times daily with meals.     No current  facility-administered medications for this visit.     PHYSICAL EXAMINATION:  ECOG PERFORMANCE STATUS: 0 - Asymptomatic   Vitals:   10/14/16 1020  BP: (!) 128/52  Pulse: 85  Resp: 18  SpO2: 97%    Filed Weights   10/14/16 1020  Weight: 171 lb (77.6 kg)     Physical Exam  Constitutional: She is oriented to person, place, and time and well-developed, well-nourished, and in no distress. No distress.  HENT:  Head: Normocephalic and atraumatic.  Mouth/Throat: No oropharyngeal exudate.  Eyes: Pupils are equal, round, and reactive to light. Conjunctivae are normal. No scleral icterus.  Neck: Normal range of motion. Neck supple. No JVD present.  Cardiovascular: Normal rate, regular rhythm and normal heart sounds.  Exam reveals no gallop and no friction rub.   No murmur heard. Pulmonary/Chest: Breath sounds normal. No respiratory distress. She has no wheezes. She has no rales.  Abdominal: Soft. Bowel sounds are normal. She exhibits no distension. There is no tenderness. There is no guarding.  Musculoskeletal: She exhibits no edema or tenderness.  Lymphadenopathy:    She has no cervical adenopathy.  Neurological: She is alert and oriented to person, place, and time. No cranial nerve deficit.  Skin: Skin is warm and dry. No rash noted. No erythema. No pallor.  Psychiatric: Affect and judgment normal.     LABORATORY DATA: I have personally reviewed the data as listed:  Orders Only on 10/02/2016  Component Date Value Ref Range Status  . Sodium 10/02/2016 137  135 - 146 mmol/L Final  . Potassium 10/02/2016 4.2  3.5 - 5.3 mmol/L Final  . Chloride 10/02/2016 94* 98 - 110 mmol/L Final  . CO2 10/02/2016 25  20 - 32 mmol/L Final   Comment: ** Please note change in reference range(s). **     . Glucose, Bld 10/02/2016 97  65 - 99 mg/dL Final  . BUN 10/02/2016 48* 7 - 25 mg/dL Final  . Creat 10/02/2016 14.29* 0.50 - 0.99 mg/dL Final   Comment: Result repeated and verified.   For  patients > or = 63 years of age: The upper reference limit for Creatinine is approximately 13% higher for people identified as African-American.     . Total Bilirubin 10/02/2016 0.4  0.2 - 1.2 mg/dL Final  . Alkaline Phosphatase 10/02/2016 158* 33 - 130 U/L Final  . AST 10/02/2016 19  10 - 35 U/L Final  . ALT 10/02/2016 9  6 - 29 U/L Final  . Total Protein 10/02/2016 5.9* 6.1 - 8.1 g/dL Final  . Albumin 10/02/2016 3.0* 3.6 - 5.1 g/dL Final  .  Calcium 10/02/2016 8.9  8.6 - 10.4 mg/dL Final  . Hgb A1c MFr Bld 10/02/2016 6.2* <5.7 % Final   Comment:   For someone without known diabetes, a hemoglobin A1c value between 5.7% and 6.4% is consistent with prediabetes and should be confirmed with a follow-up test.   For someone with known diabetes, a value <7% indicates that their diabetes is well controlled. A1c targets should be individualized based on duration of diabetes, age, co-morbid conditions and other considerations.   This assay result is consistent with an increased risk of diabetes.   Currently, no consensus exists regarding use of hemoglobin A1c for diagnosis of diabetes in children.     . Mean Plasma Glucose 10/02/2016 131  mg/dL Final  Appointment on 09/22/2016  Component Date Value Ref Range Status  . WBC 09/22/2016 10.8* 4.0 - 10.5 K/uL Final  . RBC 09/22/2016 3.60* 3.87 - 5.11 MIL/uL Final  . Hemoglobin 09/22/2016 10.5* 12.0 - 15.0 g/dL Final  . HCT 09/22/2016 33.5* 36.0 - 46.0 % Final  . MCV 09/22/2016 93.1  78.0 - 100.0 fL Final  . MCH 09/22/2016 29.2  26.0 - 34.0 pg Final  . MCHC 09/22/2016 31.3  30.0 - 36.0 g/dL Final  . RDW 09/22/2016 16.6* 11.5 - 15.5 % Final  . Platelets 09/22/2016 388  150 - 400 K/uL Final   Comment: SPECIMEN CHECKED FOR CLOTS PLATELET COUNT CONFIRMED BY SMEAR   . Neutrophils Relative % 09/22/2016 56  % Final  . Neutro Abs 09/22/2016 6.1  1.7 - 7.7 K/uL Final  . Lymphocytes Relative 09/22/2016 32  % Final  . Lymphs Abs 09/22/2016 3.5   0.7 - 4.0 K/uL Final  . Monocytes Relative 09/22/2016 8  % Final  . Monocytes Absolute 09/22/2016 0.9  0.1 - 1.0 K/uL Final  . Eosinophils Relative 09/22/2016 3  % Final  . Eosinophils Absolute 09/22/2016 0.3  0.0 - 0.7 K/uL Final  . Basophils Relative 09/22/2016 1  % Final  . Basophils Absolute 09/22/2016 0.1  0.0 - 0.1 K/uL Final  . RBC Morphology 09/22/2016 POLYCHROMASIA PRESENT   Final  . Sodium 09/22/2016 136  135 - 145 mmol/L Final  . Potassium 09/22/2016 3.9  3.5 - 5.1 mmol/L Final  . Chloride 09/22/2016 94* 101 - 111 mmol/L Final  . CO2 09/22/2016 26  22 - 32 mmol/L Final  . Glucose, Bld 09/22/2016 126* 65 - 99 mg/dL Final  . BUN 09/22/2016 46* 6 - 20 mg/dL Final  . Creatinine, Ser 09/22/2016 14.29* 0.44 - 1.00 mg/dL Final  . Calcium 09/22/2016 9.4  8.9 - 10.3 mg/dL Final  . Total Protein 09/22/2016 7.1  6.5 - 8.1 g/dL Final  . Albumin 09/22/2016 2.8* 3.5 - 5.0 g/dL Final  . AST 09/22/2016 17  15 - 41 U/L Final  . ALT 09/22/2016 5* 14 - 54 U/L Final  . Alkaline Phosphatase 09/22/2016 125  38 - 126 U/L Final  . Total Bilirubin 09/22/2016 0.6  0.3 - 1.2 mg/dL Final  . GFR calc non Af Amer 09/22/2016 2* >60 mL/min Final  . GFR calc Af Amer 09/22/2016 3* >60 mL/min Final   Comment: (NOTE) The eGFR has been calculated using the CKD EPI equation. This calculation has not been validated in all clinical situations. eGFR's persistently <60 mL/min signify possible Chronic Kidney Disease.   . Anion gap 09/22/2016 16* 5 - 15 Final  . IgG (Immunoglobin G), Serum 09/22/2016 974  700 - 1,600 mg/dL Final  . IgA 09/22/2016 287  87 - 352 mg/dL Final  . IgM, Serum 09/22/2016 42  26 - 217 mg/dL Final  . Total Protein ELP 09/22/2016 6.3  6.0 - 8.5 g/dL Corrected  . Albumin SerPl Elph-Mcnc 09/22/2016 3.0  2.9 - 4.4 g/dL Corrected  . Alpha 1 09/22/2016 0.3  0.0 - 0.4 g/dL Corrected  . Alpha2 Glob SerPl Elph-Mcnc 09/22/2016 1.2* 0.4 - 1.0 g/dL Corrected  . B-Globulin SerPl Elph-Mcnc 09/22/2016  0.9  0.7 - 1.3 g/dL Corrected  . Gamma Glob SerPl Elph-Mcnc 09/22/2016 0.9  0.4 - 1.8 g/dL Corrected  . M Protein SerPl Elph-Mcnc 09/22/2016 Not Observed  Not Observed g/dL Corrected  . Globulin, Total 09/22/2016 3.3  2.2 - 3.9 g/dL Corrected  . Albumin/Glob SerPl 09/22/2016 1.0  0.7 - 1.7 Corrected  . IFE 1 09/22/2016 Comment   Corrected   An apparent normal immunofixation pattern.  . Please Note 09/22/2016 Comment   Corrected   Comment: (NOTE) Protein electrophoresis scan will follow via computer, mail, or courier delivery. Performed At: [facility] [place], [place] [id number] Lindon Romp MD [id number]   . Kappa free light chain 09/22/2016 206.7* 3.3 - 19.4 mg/L Final  . Lamda free light chains 09/22/2016 146.0* 5.7 - 26.3 mg/L Final  . Kappa, lamda light chain ratio 09/22/2016 1.42  0.26 - 1.65 Final   Comment: (NOTE) Performed At: [facility] [place], [place] [id number] Lindon Romp MD [id number]   . Beta-2 Microglobulin 09/22/2016 37.9* 0.6 - 2.4 mg/L Final   Comment: (NOTE) Siemens Immulite 2000 Immunochemiluminometric assay [facility]) **Results verified by repeat testing** Performed At: [facility] [place], [place] [id number] Lindon Romp MD [id number]   . Erythropoietin 09/22/2016 347.2* 2.6 - 18.5 mIU/mL Final   Comment: (NOTE) [employer] UniCel DxI 800 Immunoassay System Performed At: [facility] [place], [place] [id number] Lindon Romp MD [id number]   . Iron 09/22/2016 76  28 - 170 ug/dL Final  . TIBC 09/22/2016 231* 250 - 450 ug/dL Final  . Saturation Ratios 09/22/2016 33* 10.4 - 31.8 % Final  . UIBC 09/22/2016 155  ug/dL Final   Performed at [place] [address] [place], [place] [address]  . Ferritin 09/22/2016 1285* 11 - 307 ng/mL Final   Performed at [facility], [place] [address] [place], [place] [address]  .  Vitamin B-12 09/22/2016 6564* 180 - 914 pg/mL Final   Comment: (NOTE) This assay is not validated for testing neonatal or myeloproliferative syndrome specimens for Vitamin B12 levels. Performed at [facility], [place] [address], [place], [place] [address]   Admission on 09/14/2016, Discharged on 09/14/2016  Component Date Value Ref Range Status  . Glucose-Capillary 09/14/2016 166* 65 - 99 mg/dL Final    RADIOGRAPHIC STUDIES: I have personally reviewed the radiological images as listed and agree with the findings in the report  No results found.  ASSESSMENT/PLAN 1. SPEP demonstrating  faint band in the beta region which may represent a monoclonal gammopathy -Reviewed patient's myeloma labwork in detail with her. Repeat SPEP/IFE was negative for monoclonal gammopathy. Her elevated free light chains (with normal ratio) and elevated beta 2 microglobulin are due to her underlying ESRD. Skeletal survey on 09/22/16 was negative for lytic lesions. -No further workup necessary at this time.   2. Anemia likely multifactorial from GI bleed from small bowel ulcer and also anemia of chronic renal disease. - Iron and B12  levels are elevated. - Continue aranesp/procrit qmonthly through her nephrologist. - Defer to her nephrologist regarding her ESRD related anemia.  RTC PRN.   All questions were answered. The patient knows to call the clinic with any problems, questions or concerns.  This note was electronically signed.    Twana First, MD  10/14/2016 10:20 AM

## 2016-11-11 ENCOUNTER — Other Ambulatory Visit: Payer: Self-pay | Admitting: "Endocrinology

## 2016-12-10 ENCOUNTER — Ambulatory Visit (INDEPENDENT_AMBULATORY_CARE_PROVIDER_SITE_OTHER): Payer: PRIVATE HEALTH INSURANCE | Admitting: Gastroenterology

## 2016-12-10 ENCOUNTER — Encounter: Payer: Self-pay | Admitting: Gastroenterology

## 2016-12-10 VITALS — BP 140/74 | HR 85 | Temp 97.4°F | Ht 65.0 in | Wt 180.8 lb

## 2016-12-10 DIAGNOSIS — K2970 Gastritis, unspecified, without bleeding: Secondary | ICD-10-CM | POA: Diagnosis not present

## 2016-12-10 DIAGNOSIS — B9681 Helicobacter pylori [H. pylori] as the cause of diseases classified elsewhere: Secondary | ICD-10-CM | POA: Insufficient documentation

## 2016-12-10 DIAGNOSIS — D649 Anemia, unspecified: Secondary | ICD-10-CM

## 2016-12-10 NOTE — Patient Instructions (Signed)
Please collect stool and take to the lab as discussed.  Please have your latest labs faxed to [place] at [phone].

## 2016-12-10 NOTE — Progress Notes (Signed)
Primary Care Physician: Dhivianathan, Candida Peeling, MD  Primary Gastroenterologist:  Barney Drain, MD   Chief Complaint  Patient presents with  . Follow-up    anemia/H pylori gastritis- doing well    HPI: Selena Rowe is a 61 y.o. female here for follow-up. She has a history of declining hemoglobin, heme positive stools. Typical hemoglobin in the 10 range in the setting of end-stage renal disease on peritoneal dialysis. Back on June 6 her hemoglobin dropped to 6.4. She received blood transfusions.She underwent a colonoscopy back in July showing 2 simple adenomas removed. EGD with moderate erosive gastritis/duodenitis, H. pylori positive. She was treated with Pylera and pantoprazole twice a day.Small bowel capsule study showed ulcer versus healing biopsy site in the duodenal bulb.  She has seen hematology for abnormal SPEP. See below for recommendations from Dr. Talbert Cage.  Patient states her most recent hemoglobin was 13. She is going to have the results faxed to [place]. Denies any GI symptoms. No abdominal pain. Bowel movements regular. No blood in the stool or melena. No heartburn or vomiting.    Copied from Dr. Barbaraann Share Zhou's consult note ASSESSMENT/PLAN 1. SPEP demonstrating  faint band in the beta region which may represent a monoclonal gammopathy -Reviewed patient's myeloma labwork in detail with her. Repeat SPEP/IFE was negative for monoclonal gammopathy. Her elevated free light chains (with normal ratio) and elevated beta 2 microglobulin are due to her underlying ESRD. Skeletal survey on 09/22/16 was negative for lytic lesions. -No further workup necessary at this time.   2. Anemia likely multifactorial from GI bleed from small bowel ulcer and also anemia of chronic renal disease. - Iron and B12 levels are elevated. - Continue aranesp/procrit qmonthly through her nephrologist. - Defer to her nephrologist regarding her ESRD related anemia.   Current Outpatient Prescriptions    Medication Sig Dispense Refill  . acetaminophen (TYLENOL) 500 MG tablet Take 1,000-1,500 mg by mouth at bedtime as needed for moderate pain or headache.    Marland Kitchen atorvastatin (LIPITOR) 40 MG tablet Take 40 mg by mouth every evening.     . calcitRIOL (ROCALTROL) 0.25 MCG capsule Take 0.25 mcg by mouth every Monday, Wednesday, and Friday.     . carvedilol (COREG) 25 MG tablet Take 25 mg by mouth 2 (two) times daily.    . cinacalcet (SENSIPAR) 30 MG tablet Take 30 mg by mouth daily.    . clopidogrel (PLAVIX) 75 MG tablet Take 75 mg by mouth daily.    . Cyanocobalamin 2500 MCG CHEW Chew by mouth daily.    Marland Kitchen doxazosin (CARDURA) 1 MG tablet Take 1 mg by mouth at bedtime.     . ergocalciferol (VITAMIN D2) 50000 UNITS capsule Take 50,000 Units by mouth every 30 (thirty) days.     Marland Kitchen gabapentin (NEURONTIN) 100 MG capsule Take 100 mg by mouth daily as needed (pain).     . hydrALAZINE (APRESOLINE) 50 MG tablet Take 50 mg by mouth 2 (two) times daily.    . insulin degludec (TRESIBA FLEXTOUCH) 100 UNIT/ML SOPN FlexTouch Pen Inject 0.4 mLs (40 Units total) into the skin daily at 10 pm. 5 pen 2  . insulin lispro (HUMALOG KWIKPEN) 100 UNIT/ML KiwkPen Inject 10-15 Units into the skin 3 (three) times daily as needed (high blood sugar). Per sliding scale    . Insulin Pen Needle (B-D ULTRAFINE III SHORT PEN) 31G X 8 MM MISC Use as directed 4 x daily 300 each 2  . montelukast (SINGULAIR) 10 MG  tablet Take 10 mg by mouth at bedtime.    . multivitamin (RENA-VIT) TABS tablet Take 1 tablet by mouth daily.    . sucroferric oxyhydroxide (VELPHORO) 500 MG chewable tablet Chew 500 mg by mouth 3 (three) times daily with meals.     No current facility-administered medications for this visit.     Allergies as of 12/10/2016 - Review Complete 12/10/2016  Allergen Reaction Noted  . Amlodipine Swelling 08/07/2016    ROS:  General: Negative for anorexia, weight loss, fever, chills, fatigue, weakness. ENT: Negative for  hoarseness, difficulty swallowing , nasal congestion. CV: Negative for chest pain, angina, palpitations, dyspnea on exertion, peripheral edema.  Respiratory: Negative for dyspnea at rest, dyspnea on exertion, cough, sputum, wheezing.  GI: See history of present illness. GU:  Negative for dysuria, hematuria, urinary incontinence, urinary frequency, nocturnal urination.  Endo: Negative for unusual weight change.    Physical Examination:   BP 140/74   Pulse 85   Temp (!) 97.4 F (36.3 C) (Oral)   Ht 5\' 5"  (1.651 m)   Wt 180 lb 12.8 oz (82 kg)   BMI 30.09 kg/m   General: Well-nourished, well-developed in no acute distress.  Eyes: No icterus. Mouth: Oropharyngeal mucosa moist and pink , no lesions erythema or exudate. Lungs: Clear to auscultation bilaterally.  Heart: Regular rate and rhythm, no murmurs rubs or gallops.  Abdomen: Bowel sounds are normal, nontender, nondistended, no hepatosplenomegaly or masses, no abdominal bruits or hernia , no rebound or guarding.  Peritoneal catheter noted. Extremities: No lower extremity edema. No clubbing or deformities. Neuro: Alert and oriented x 4   Skin: Warm and dry, no jaundice.   Psych: Alert and cooperative, normal mood and affect.  Labs:  Lab Results  Component Value Date   CREATININE 14.29 (H) 10/02/2016   BUN 48 (H) 10/02/2016   NA 137 10/02/2016   K 4.2 10/02/2016   CL 94 (L) 10/02/2016   CO2 25 10/02/2016   Lab Results  Component Value Date   ALT 9 10/02/2016   AST 19 10/02/2016   ALKPHOS 158 (H) 10/02/2016   BILITOT 0.4 10/02/2016   Lab Results  Component Value Date   WBC 10.8 (H) 09/22/2016   HGB 10.5 (L) 09/22/2016   HCT 33.5 (L) 09/22/2016   MCV 93.1 09/22/2016   PLT 388 09/22/2016   Lab Results  Component Value Date   IRON 76 09/22/2016   TIBC 231 (L) 09/22/2016   FERRITIN 1,285 (H) 09/22/2016    Imaging Studies: No results found.

## 2016-12-10 NOTE — Assessment & Plan Note (Signed)
Anemia, heme positive stool with extensive evaluation as outlined above. H. pylori gastritis treated with Pylera. Need to confirm eradication. Plan for H. pylori stool antigen. Patient not on PPI or antibiotics. She will fax over a copy of her most recent labs, she reports hemoglobin 13. Further recommendations to follow.

## 2016-12-11 NOTE — Progress Notes (Signed)
CC'D TO PCP °

## 2017-01-04 ENCOUNTER — Other Ambulatory Visit: Payer: Self-pay | Admitting: "Endocrinology

## 2017-01-20 ENCOUNTER — Ambulatory Visit: Payer: PRIVATE HEALTH INSURANCE | Admitting: "Endocrinology

## 2017-01-21 LAB — RENAL FUNCTION PANEL
Albumin: 3.4 g/dL — ABNORMAL LOW (ref 3.6–5.1)
BUN / CREAT RATIO: 4 (calc) — AB (ref 6–22)
BUN: 57 mg/dL — ABNORMAL HIGH (ref 7–25)
CALCIUM: 8.7 mg/dL (ref 8.6–10.4)
CO2: 25 mmol/L (ref 20–32)
CREATININE: 15.01 mg/dL — AB (ref 0.50–0.99)
Chloride: 91 mmol/L — ABNORMAL LOW (ref 98–110)
GLUCOSE: 89 mg/dL (ref 65–139)
POTASSIUM: 4.7 mmol/L (ref 3.5–5.3)
Phosphorus: 8.2 mg/dL — ABNORMAL HIGH (ref 2.5–4.5)
SODIUM: 135 mmol/L (ref 135–146)

## 2017-01-21 LAB — T4, FREE: Free T4: 0.8 ng/dL (ref 0.8–1.8)

## 2017-01-21 LAB — HEMOGLOBIN A1C
Hgb A1c MFr Bld: 9.7 %{Hb} — ABNORMAL HIGH
Mean Plasma Glucose: 232 (calc)
eAG (mmol/L): 12.8 (calc)

## 2017-01-21 LAB — TSH: TSH: 1.75 mIU/L (ref 0.40–4.50)

## 2017-01-25 LAB — HELICOBACTER PYLORI  SPECIAL ANTIGEN
MICRO NUMBER:: 81349271
SPECIMEN QUALITY: ADEQUATE

## 2017-02-01 NOTE — Progress Notes (Signed)
Please let patient know her h.pylori stool ag was NEGATIVE. Medication successfully treated it.

## 2017-02-03 NOTE — Progress Notes (Signed)
I left the complete Vm with results on cell number [phone] per pt release info.

## 2017-03-22 ENCOUNTER — Other Ambulatory Visit: Payer: Self-pay

## 2017-03-22 DIAGNOSIS — I739 Peripheral vascular disease, unspecified: Secondary | ICD-10-CM

## 2017-04-12 ENCOUNTER — Ambulatory Visit (INDEPENDENT_AMBULATORY_CARE_PROVIDER_SITE_OTHER): Payer: Medicare Other | Admitting: "Endocrinology

## 2017-04-12 ENCOUNTER — Encounter: Payer: Self-pay | Admitting: "Endocrinology

## 2017-04-12 VITALS — BP 120/75 | HR 74 | Ht 65.0 in | Wt 178.0 lb

## 2017-04-12 DIAGNOSIS — N186 End stage renal disease: Secondary | ICD-10-CM

## 2017-04-12 DIAGNOSIS — Z9119 Patient's noncompliance with other medical treatment and regimen: Secondary | ICD-10-CM

## 2017-04-12 DIAGNOSIS — Z91199 Patient's noncompliance with other medical treatment and regimen due to unspecified reason: Secondary | ICD-10-CM

## 2017-04-12 DIAGNOSIS — E1122 Type 2 diabetes mellitus with diabetic chronic kidney disease: Secondary | ICD-10-CM | POA: Diagnosis not present

## 2017-04-12 DIAGNOSIS — E782 Mixed hyperlipidemia: Secondary | ICD-10-CM | POA: Diagnosis not present

## 2017-04-12 DIAGNOSIS — I10 Essential (primary) hypertension: Secondary | ICD-10-CM

## 2017-04-12 MED ORDER — INSULIN GLARGINE 300 UNIT/ML ~~LOC~~ SOPN: 40.0000 [IU] | PEN_INJECTOR | Freq: Every day | SUBCUTANEOUS | 2 refills | Status: DC

## 2017-04-12 NOTE — Patient Instructions (Signed)

## 2017-04-12 NOTE — Progress Notes (Signed)
Subjective:    Patient ID: Selena Rowe, female    DOB: 02-10-1956,    Past Medical History:  Diagnosis Date  . Diabetes mellitus, type II ([place])   . Hyperlipidemia   . Hypertension   . Kidney disease    on diaylsis since 2016  . Stroke [facility]) 2014   Past Surgical History:  Procedure Laterality Date  . ABDOMINAL HYSTERECTOMY    . AGILE CAPSULE N/A 09/14/2016   Procedure: AGILE CAPSULE;  Surgeon: Danie Binder, MD;  [facility]: [facility];  Service: Endoscopy;  Laterality: N/A;  . BIOPSY  09/09/2016   Procedure: BIOPSY;  Surgeon: Danie Binder, MD;  [facility];  Service: Endoscopy;;  duodenal;gastric  . CESAREAN SECTION    . COLONOSCOPY  09/2013   Dr. Earley Brooke: Performed for IDA due to blood loss, normal colonoscopy  . COLONOSCOPY  12/2005   Dr. Earley Brooke: hemorrhoids  . COLONOSCOPY N/A 09/09/2016   Procedure: COLONOSCOPY;  Surgeon: Danie Binder, MD;  [facility]: [facility];  Service: Endoscopy;  Laterality: N/A;  1015  . DIALYSIS FISTULA CREATION    . ESOPHAGOGASTRODUODENOSCOPY  02/2014   Dr. Earley Brooke: Hiatal hernia, few erosions of the antrum status post biopsy for clotest, small bowel biopsy showed mild chronic inflammation of the duodenum with changes of celiac disease not seen  . ESOPHAGOGASTRODUODENOSCOPY  12/2005   Dr. Alethia Berthold, Hiatal hernia  . ESOPHAGOGASTRODUODENOSCOPY N/A 09/09/2016   Procedure: ESOPHAGOGASTRODUODENOSCOPY (EGD);  Surgeon: Danie Binder, MD;  [facility];  Service: Endoscopy;  Laterality: N/A;  . GIVENS CAPSULE STUDY N/A 09/21/2016   Procedure: GIVENS CAPSULE STUDY;  Surgeon: Danie Binder, MD;  [facility];  Service: Endoscopy;  Laterality: N/A;  7:30am, pt to arrive at 7:00am  . POLYPECTOMY  09/09/2016   Procedure: POLYPECTOMY;  Surgeon: Danie Binder, MD;  [facility];  Service: Endoscopy;;  cecal and hepatic flexure polyp  . TUBAL LIGATION     Social History   Socioeconomic History  .  Marital status: Married    Spouse name: None  . Number of children: None  . Years of education: None  . Highest education level: None  Social Needs  . Financial resource strain: None  . Food insecurity - worry: None  . Food insecurity - inability: None  . Transportation needs - medical: None  . Transportation needs - non-medical: None  Occupational History  . None  Tobacco Use  . Smoking status: Never Smoker  . Smokeless tobacco: Never Used  Substance and Sexual Activity  . Alcohol use: No    Alcohol/week: 0.0 oz  . Drug use: No  . Sexual activity: None  Other Topics Concern  . None  Social History Narrative  . None   Outpatient Encounter Medications as of 04/12/2017  Medication Sig  . aspirin EC 81 MG tablet Take 81 mg by mouth daily.  Marland Kitchen acetaminophen (TYLENOL) 500 MG tablet Take 1,000-1,500 mg by mouth at bedtime as needed for moderate pain or headache.  Marland Kitchen atorvastatin (LIPITOR) 40 MG tablet Take 40 mg by mouth every evening.   . calcitRIOL (ROCALTROL) 0.25 MCG capsule Take 0.25 mcg by mouth every Monday, Wednesday, and Friday.   . carvedilol (COREG) 25 MG tablet Take 25 mg by mouth 2 (two) times daily.  . cinacalcet (SENSIPAR) 30 MG tablet Take 30 mg by mouth daily.  . clopidogrel (PLAVIX) 75 MG tablet Take 75 mg by mouth daily.  Marland Kitchen  Cyanocobalamin 2500 MCG CHEW Chew by mouth daily.  Marland Kitchen doxazosin (CARDURA) 1 MG tablet Take 1 mg by mouth at bedtime.   . ergocalciferol (VITAMIN D2) 50000 UNITS capsule Take 50,000 Units by mouth every 30 (thirty) days.   Marland Kitchen gabapentin (NEURONTIN) 100 MG capsule Take 100 mg by mouth daily as needed (pain).   . hydrALAZINE (APRESOLINE) 50 MG tablet Take 50 mg by mouth 2 (two) times daily.  . Insulin Glargine (TOUJEO SOLOSTAR) 300 UNIT/ML SOPN Inject 40 Units into the skin at bedtime.  . insulin lispro (HUMALOG KWIKPEN) 100 UNIT/ML KiwkPen Inject 10-15 Units into the skin 3 (three) times daily as needed (high blood sugar). Per sliding scale  .  Insulin Pen Needle (B-D ULTRAFINE III SHORT PEN) 31G X 8 MM MISC Use as directed 4 x daily  . montelukast (SINGULAIR) 10 MG tablet Take 10 mg by mouth at bedtime.  . multivitamin (RENA-VIT) TABS tablet Take 1 tablet by mouth daily.  . sucroferric oxyhydroxide (VELPHORO) 500 MG chewable tablet Chew 500 mg by mouth 3 (three) times daily with meals.  . [DISCONTINUED] TRESIBA FLEXTOUCH 100 UNIT/ML SOPN FlexTouch Pen INJECT 40 UNITS INTO THE SKIN DAILY AT 10 PM.   No facility-administered encounter medications on file as of 04/12/2017.    ALLERGIES: Allergies  Allergen Reactions  . Amlodipine Swelling   VACCINATION STATUS:  There is no immunization history on file for this patient.  Diabetes  She presents for her follow-up diabetic visit. She has type 2 diabetes mellitus. Onset time: She was diagnosed at approximate age of 22 years preceded by history of gestational diabetes in 55. Her disease course has been worsening. There are no hypoglycemic associated symptoms. Pertinent negatives for hypoglycemia include no confusion, headaches, pallor or seizures. (She had one episode of hypoglycemia to 40 associated with symptoms including confusion.) Associated symptoms include fatigue and polydipsia. Pertinent negatives for diabetes include no chest pain and no polyphagia. There are no hypoglycemic complications. Symptoms are worsening. Diabetic complications include a CVA, nephropathy and retinopathy. Risk factors for coronary artery disease include dyslipidemia, diabetes mellitus, hypertension and sedentary lifestyle. Current diabetic treatment includes insulin injections. She is compliant with treatment some of the time. Her weight is stable. She is following a generally unhealthy diet. She has had a previous visit with a dietitian. She rarely participates in exercise. Home blood sugar record trend: she did not monitor enough. Her overall blood glucose range is >200 mg/dl. (She brought with her meter  showing random monitoring of blood glucose averaging greater than 200 mg/dL.  Her most recent A1c is significantly higher at 9.7% compared to 6.2% last visit.) An ACE inhibitor/angiotensin II receptor blocker is being taken. Eye exam is current.  Hypertension  This is a chronic problem. The current episode started more than 1 year ago. The problem is uncontrolled (Patient states she did not take her blood pressure medications this morning.). Pertinent negatives include no chest pain, headaches, palpitations or shortness of breath. Risk factors for coronary artery disease include dyslipidemia, diabetes mellitus and sedentary lifestyle. Past treatments include beta blockers, diuretics and ACE inhibitors. Hypertensive end-organ damage includes CVA and retinopathy.  Hyperlipidemia  This is a chronic problem. The current episode started more than 1 year ago. The problem is uncontrolled. Exacerbating diseases include diabetes. Pertinent negatives include no chest pain, myalgias or shortness of breath. Current antihyperlipidemic treatment includes statins. Risk factors for coronary artery disease include diabetes mellitus, dyslipidemia, hypertension and a sedentary lifestyle.     Review  of Systems  Constitutional: Positive for fatigue. Negative for unexpected weight change.  HENT: Negative for trouble swallowing and voice change.   Eyes: Positive for visual disturbance.  Respiratory: Negative for cough, shortness of breath and wheezing.   Cardiovascular: Negative for chest pain, palpitations and leg swelling.  Gastrointestinal: Negative for diarrhea, nausea and vomiting.  Endocrine: Positive for polydipsia. Negative for cold intolerance, heat intolerance and polyphagia.  Musculoskeletal: Negative for arthralgias and myalgias.  Skin: Negative for color change, pallor, rash and wound.  Neurological: Negative for seizures and headaches.  Psychiatric/Behavioral: Negative for confusion and suicidal ideas.     Objective:    BP 120/75   Pulse 74   Ht 5\' 5"  (1.651 m)   Wt 178 lb (80.7 kg)   BMI 29.62 kg/m   Wt Readings from Last 3 Encounters:  04/12/17 178 lb (80.7 kg)  12/10/16 180 lb 12.8 oz (82 kg)  10/14/16 171 lb (77.6 kg)    Physical Exam  Constitutional: She is oriented to person, place, and time. She appears well-developed.  HENT:  Head: Normocephalic and atraumatic.  Eyes: EOM are normal.  Neck: Normal range of motion. Neck supple. No tracheal deviation present. No thyromegaly present.  Cardiovascular: Normal rate and regular rhythm.  Pulmonary/Chest: Effort normal and breath sounds normal.  Abdominal: Soft. Bowel sounds are normal. There is no tenderness. There is no guarding.  Musculoskeletal: Normal range of motion. She exhibits no edema.  Neurological: She is alert and oriented to person, place, and time. She has normal reflexes. No cranial nerve deficit. Coordination normal.  Skin: Skin is warm and dry. No rash noted. No erythema. No pallor.  Psychiatric: She has a normal mood and affect. Judgment normal.    Recent Results (from the past 2160 hour(s))  Renal function panel     Status: Abnormal   Collection Time: 01/20/17 12:33 PM  Result Value Ref Range   Glucose, Bld 89 65 - 139 mg/dL    Comment: .        Non-fasting reference interval .    BUN 57 (H) 7 - 25 mg/dL   Creat 15.01 (H) 0.50 - 0.99 mg/dL    Comment: Verified by repeat analysis. . For patients >40 years of age, the reference limit for Creatinine is approximately 13% higher for people identified as African-American. .    BUN/Creatinine Ratio 4 (L) 6 - 22 (calc)   Sodium 135 135 - 146 mmol/L   Potassium 4.7 3.5 - 5.3 mmol/L   Chloride 91 (L) 98 - 110 mmol/L   CO2 25 20 - 32 mmol/L   Calcium 8.7 8.6 - 10.4 mg/dL   Phosphorus 8.2 (H) 2.5 - 4.5 mg/dL   Albumin 3.4 (L) 3.6 - 5.1 g/dL  Hemoglobin A1c     Status: Abnormal   Collection Time: 01/20/17 12:33 PM  Result Value Ref Range   Hgb A1c  MFr Bld 9.7 (H) <5.7 % of total Hgb    Comment: For someone without known diabetes, a hemoglobin A1c value of 6.5% or greater indicates that they may have  diabetes and this should be confirmed with a follow-up  test. . For someone with known diabetes, a value <7% indicates  that their diabetes is well controlled and a value  greater than or equal to 7% indicates suboptimal  control. A1c targets should be individualized based on  duration of diabetes, age, comorbid conditions, and  other considerations. . Currently, no consensus exists regarding use of hemoglobin A1c for  diagnosis of diabetes for children. .    Mean Plasma Glucose 232 (calc)   eAG (mmol/L) 12.8 (calc)  TSH     Status: None   Collection Time: 01/20/17 12:33 PM  Result Value Ref Range   TSH 1.75 0.40 - 4.50 mIU/L  T4, free     Status: None   Collection Time: 01/20/17 12:33 PM  Result Value Ref Range   Free T4 0.8 0.8 - 1.8 ng/dL  Helicobacter pylori special antigen     Status: None   Collection Time: 01/22/17  9:23 AM  Result Value Ref Range   MICRO NUMBER: [id number]    SPECIMEN QUALITY ADEQUATE    SOURCE: STOOL    STATUS: FINAL    RESULT:      Not Detected  Antimicrobials, proton pump inhibitors, and bismuth preparations inhibit H. pylori and ingestion up to two weeks prior to testing may cause false negative results. If clinically indicated the test should be repeated on a new specimen  obtained two weeks after discontinuing treatment.      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 - Patient has currently uncontrolled symptomatic type 2 DM since  62 years of age. -She admits to have been inconsistent in taking her insulin.  She came with higher A1c of 9.7% from 6.2%.  Her meter reveals a random blood glucose testing averaging greater than 200 mg/dL   - She admits that he does not follow a  routine mealtimes.    Her diabetes is complicated by end-stage renal disease on dialysis , retinopathy, and cerebrovascular accident and patient remains at a high risk for more acute and chronic complications of diabetes which include CAD, CVA, CKD, retinopathy, and neuropathy. These are all discussed in detail with the patient.  - I have counseled the patient on diet management and weight loss, by adopting a carbohydrate restricted/protein rich diet.  -  Suggestion is made for her to avoid simple carbohydrates  from her diet including Cakes, Sweet Desserts / Pastries, Ice Cream, Soda (diet and regular), Sweet Tea, Candies, Chips, Cookies, Store Bought Juices, Alcohol in Excess of  1-2 drinks a day, Artificial Sweeteners, and "Sugar-free" Products. This will help patient to have stable blood glucose profile and potentially avoid unintended weight gain.   - I encouraged the patient to switch to  unprocessed or minimally processed complex starch and increased protein intake (animal or plant source), fruits, and vegetables.  - Patient is advised to stick to a routine mealtimes to eat 3 meals  a day and avoid unnecessary snacks ( to snack only to correct hypoglycemia).    - I have approached patient with the following individualized plan to manage diabetes and patient agrees:   - Emphasizing the need for strict compliance, I  will continue prescribe Toujeo (her insurance is changing formulary) 40 units daily at bedtime, Humalog which will be changed to NovoLog after her next visit to use 10 units 3 times daily before meals for pre-meal blood glucose readings  of 90-150mg /dl, plus patient specific correction dose for unexpected hyperglycemia above 150mg /dl, associated with strict monitoring of glucose  daily before meals and at bedtime. - Patient is warned not to take insulin without proper monitoring per orders. -Adjustment parameters are given for hypo and hyperglycemia in writing. -Patient is  encouraged to call clinic for blood glucose levels less than 70 or above 300 mg /dl.  -Patient  is not a candidate for metformin andSGLT2 inhibitors due to CKD.  - Patient specific target  A1c;  LDL, HDL, Triglycerides, and  Waist Circumference were discussed in detail.  2) BP/HTN: Her blood pressure is controlled to target.   I advised her to continue carvedilol 12.5 mg by mouth twice a day lisinopril 10 mg by mouth daily. She is also following with nephrology.   3) Lipids/HPL:  Controlled , LDL at 55.  continue statins. 4)  Weight/Diet: CDE Consult has been  initiated , exercise, and detailed carbohydrates information provided.  5) Chronic Care/Health Maintenance:  -Patient is on ACEI/ARB and Statin medications and encouraged to continue to follow up with Ophthalmology, Podiatrist at least yearly or according to recommendations, and advised to   stay away from smoking. I have recommended yearly flu vaccine and pneumonia vaccination at least every 5 years; moderate intensity exercise for up to 150 minutes weekly; and  sleep for at least 7 hours a day.  I advised patient to maintain close follow up with their PCP for primary care needs. - Time spent with the patient: 25 min, of which >50% was spent in reviewing her blood glucose logs , discussing her hypo- and hyper-glycemic episodes, reviewing her current and  previous labs and insulin doses and developing a plan to avoid hypo- and hyper-glycemia. Please refer to Patient Instructions for Blood Glucose Monitoring and Insulin/Medications Dosing Guide"  in media tab for additional information.  Follow up plan: Return in about 2 weeks (around 04/26/2017) for follow up with meter and logs- no labs.  Glade Lloyd, MD Phone: [phone]  Fax: [phone]  -  This note was partially dictated with voice recognition software. Similar sounding words can be transcribed inadequately or may not  be corrected upon review.  04/12/2017, 11:23 AM

## 2017-04-26 ENCOUNTER — Other Ambulatory Visit: Payer: Self-pay

## 2017-04-26 ENCOUNTER — Encounter: Payer: Self-pay | Admitting: Vascular Surgery

## 2017-04-26 ENCOUNTER — Ambulatory Visit (HOSPITAL_COMMUNITY)
Admission: RE | Admit: 2017-04-26 | Discharge: 2017-04-26 | Disposition: A | Discharge status: Home / Self Care | Payer: Medicare Other | Source: Ambulatory Visit | Attending: Vascular Surgery | Admitting: Vascular Surgery

## 2017-04-26 ENCOUNTER — Ambulatory Visit (INDEPENDENT_AMBULATORY_CARE_PROVIDER_SITE_OTHER): Payer: Medicare Other | Admitting: Vascular Surgery

## 2017-04-26 VITALS — BP 188/94 | HR 95 | Temp 98.1°F | Resp 16 | Ht 65.0 in | Wt 176.0 lb

## 2017-04-26 DIAGNOSIS — I70202 Unspecified atherosclerosis of native arteries of extremities, left leg: Secondary | ICD-10-CM | POA: Diagnosis not present

## 2017-04-26 DIAGNOSIS — I739 Peripheral vascular disease, unspecified: Secondary | ICD-10-CM

## 2017-04-26 DIAGNOSIS — R936 Abnormal findings on diagnostic imaging of limbs: Secondary | ICD-10-CM | POA: Insufficient documentation

## 2017-04-26 DIAGNOSIS — E1151 Type 2 diabetes mellitus with diabetic peripheral angiopathy without gangrene: Secondary | ICD-10-CM | POA: Insufficient documentation

## 2017-04-26 LAB — VAS US LOWER EXTREMITY ARTERIAL DUPLEX
LPTIBDISTSYS: -30 cm/s
LSFDPSV: -437 cm/s
LSFMPSV: -97 cm/s
LSFPPSV: -75 cm/s
Left ant tibial distal sys: 21 cm/s
RIGHT ANT DIST TIBAL SYS PSV: 33 cm/s
RSFPPSV: -99 cm/s
RTIBDISTSYS: 63 cm/s
Right super femoral dist sys PSV: -159 cm/s
Right super femoral mid sys PSV: -137 cm/s

## 2017-04-26 NOTE — Progress Notes (Signed)
Referring Physician: Dr Tyson Babinski  Patient name: Selena Rowe MRN: [id number] DOB: 1955-09-08 Sex: female  REASON FOR CONSULT: Abnormal pulse exam  HPI: Selena Rowe is a 62 y.o. female referred for evaluation of an abnormal pulse exam in both feet.  She was having her nails trimmed and an ingrown toenail infection treated recently by her podiatrist.  She was noted on exam to have an abnormal pulse exam and also decreased bleeding.  She does get some cramping and tightness in her left leg more so than her right after walking about a block.  This has been slowly worse over the last year.  However, she states that she has improved somewhat with physical therapy.  She denies rest pain or nonhealing wounds.  Other medical problems include diabetes, hyperlipidemia, hypertension.  She also has end-stage renal disease and currently does PD.  She does have a left arm AV fistula as a backup.  She is on Plavix and aspirin at the recommendation of her cardiologist.  She states she has never had a cardiac event.  She denies any chest pain or shortness of breath.  Past Medical History:  Diagnosis Date  . Diabetes mellitus, type II ([place])   . Hyperlipidemia   . Hypertension   . Kidney disease    on diaylsis since 2016  . Stroke [facility]) 2014   Past Surgical History:  Procedure Laterality Date  . ABDOMINAL HYSTERECTOMY    . AGILE CAPSULE N/A 09/14/2016   Procedure: AGILE CAPSULE;  Surgeon: Danie Binder, MD;  [facility];  Service: Endoscopy;  Laterality: N/A;  . BIOPSY  09/09/2016   Procedure: BIOPSY;  Surgeon: Danie Binder, MD;  [facility];  Service: Endoscopy;;  duodenal;gastric  . CESAREAN SECTION    . COLONOSCOPY  09/2013   Dr. Earley Brooke: Performed for IDA due to blood loss, normal colonoscopy  . COLONOSCOPY  12/2005   Dr. Earley Brooke: hemorrhoids  . COLONOSCOPY N/A 09/09/2016   Procedure: COLONOSCOPY;  Surgeon: Danie Binder, MD;  [facility];  Service:  Endoscopy;  Laterality: N/A;  1015  . DIALYSIS FISTULA CREATION    . ESOPHAGOGASTRODUODENOSCOPY  02/2014   Dr. Earley Brooke: Hiatal hernia, few erosions of the antrum status post biopsy for clotest, small bowel biopsy showed mild chronic inflammation of the duodenum with changes of celiac disease not seen  . ESOPHAGOGASTRODUODENOSCOPY  12/2005   Dr. Alethia Berthold, Hiatal hernia  . ESOPHAGOGASTRODUODENOSCOPY N/A 09/09/2016   Procedure: ESOPHAGOGASTRODUODENOSCOPY (EGD);  Surgeon: Danie Binder, MD;  [facility];  Service: Endoscopy;  Laterality: N/A;  . GIVENS CAPSULE STUDY N/A 09/21/2016   Procedure: GIVENS CAPSULE STUDY;  Surgeon: Danie Binder, MD;  [facility];  Service: Endoscopy;  Laterality: N/A;  7:30am, pt to arrive at 7:00am  . POLYPECTOMY  09/09/2016   Procedure: POLYPECTOMY;  Surgeon: Danie Binder, MD;  [facility];  Service: Endoscopy;;  cecal and hepatic flexure polyp  . TUBAL LIGATION      Family History  Problem Relation Age of Onset  . Hypertension Mother   . Kidney disease Sister   . Lung cancer Sister   . Colon cancer Neg Hx     SOCIAL HISTORY: Social History   Socioeconomic History  . Marital status: Married    Spouse name: Not on file  . Number of children: Not on file  . Years of education: Not on file  . Highest education level: Not  on file  Social Needs  . Financial resource strain: Not on file  . Food insecurity - worry: Not on file  . Food insecurity - inability: Not on file  . Transportation needs - medical: Not on file  . Transportation needs - non-medical: Not on file  Occupational History  . Not on file  Tobacco Use  . Smoking status: Never Smoker  . Smokeless tobacco: Never Used  Substance and Sexual Activity  . Alcohol use: No    Alcohol/week: 0.0 oz  . Drug use: No  . Sexual activity: Not on file  Other Topics Concern  . Not on file  Social History Narrative  . Not on file    Allergies  Allergen  Reactions  . Amlodipine Swelling    Current Outpatient Medications  Medication Sig Dispense Refill  . acetaminophen (TYLENOL) 500 MG tablet Take 1,000-1,500 mg by mouth at bedtime as needed for moderate pain or headache.    Marland Kitchen aspirin EC 81 MG tablet Take 81 mg by mouth daily.    Marland Kitchen atorvastatin (LIPITOR) 40 MG tablet Take 40 mg by mouth every evening.     . calcitRIOL (ROCALTROL) 0.25 MCG capsule Take 0.25 mcg by mouth every Monday, Wednesday, and Friday.     . carvedilol (COREG) 25 MG tablet Take 25 mg by mouth 2 (two) times daily.    . cinacalcet (SENSIPAR) 30 MG tablet Take 30 mg by mouth daily.    . clopidogrel (PLAVIX) 75 MG tablet Take 75 mg by mouth daily.    . Cyanocobalamin 2500 MCG CHEW Chew by mouth daily.    Marland Kitchen doxazosin (CARDURA) 1 MG tablet Take 1 mg by mouth at bedtime.     . ergocalciferol (VITAMIN D2) 50000 UNITS capsule Take 50,000 Units by mouth every 30 (thirty) days.     Marland Kitchen gabapentin (NEURONTIN) 100 MG capsule Take 100 mg by mouth daily as needed (pain).     . hydrALAZINE (APRESOLINE) 50 MG tablet Take 50 mg by mouth 2 (two) times daily.    . Insulin Glargine (TOUJEO SOLOSTAR) 300 UNIT/ML SOPN Inject 40 Units into the skin at bedtime. 3 pen 2  . insulin lispro (HUMALOG KWIKPEN) 100 UNIT/ML KiwkPen Inject 10-15 Units into the skin 3 (three) times daily as needed (high blood sugar). Per sliding scale    . Insulin Pen Needle (B-D ULTRAFINE III SHORT PEN) 31G X 8 MM MISC Use as directed 4 x daily 300 each 2  . montelukast (SINGULAIR) 10 MG tablet Take 10 mg by mouth at bedtime.    . multivitamin (RENA-VIT) TABS tablet Take 1 tablet by mouth daily.    . sucroferric oxyhydroxide (VELPHORO) 500 MG chewable tablet Chew 500 mg by mouth 3 (three) times daily with meals.     No current facility-administered medications for this visit.     ROS:   General:  No weight loss, Fever, chills  HEENT: No recent headaches, no nasal bleeding, no visual changes, no sore  throat  Neurologic: No dizziness, blackouts, seizures. No recent symptoms of stroke or mini- stroke. No recent episodes of slurred speech, or temporary blindness.  Cardiac: No recent episodes of chest pain/pressure, no shortness of breath at rest.  No shortness of breath with exertion.  Denies history of atrial fibrillation or irregular heartbeat  Vascular: No history of rest pain in feet.  + history of claudication.  No history of non-healing ulcer, No history of DVT   Pulmonary: No home oxygen, no productive cough, no  hemoptysis,  No asthma or wheezing  Musculoskeletal:  [ ]  Arthritis, [ ]  Low back pain,  [ ]  Joint pain  Hematologic:No history of hypercoagulable state.  No history of easy bleeding.  No history of anemia  Gastrointestinal: No hematochezia or melena,  No gastroesophageal reflux, no trouble swallowing  Urinary: [X]  chronic Kidney disease, [ ]  on HD - [ ]  MWF or [ ]  TTHS, [ ]  Burning with urination, [ ]  Frequent urination, [ ]  Difficulty urinating;   Skin: No rashes  Psychological: No history of anxiety,  No history of depression   Physical Examination  Vitals:   04/26/17 0955  BP: (!) 188/94  Pulse: 95  Resp: 16  Temp: 98.1 F (36.7 C)  TempSrc: Oral  SpO2: 95%  Weight: 176 lb (79.8 kg)  Height: 5\' 5"  (1.651 m)    Body mass index is 29.29 kg/m.  General:  Alert and oriented, no acute distress HEENT: Normal Neck: No bruit or JVD Pulmonary: Clear to auscultation bilaterally Cardiac: Regular Rate and Rhythm without murmur Abdomen: Soft, non-tender, non-distended, no mass Skin: No rash, bilateral hemosiderin staining gaiter area both legs Extremity Pulses:  2+ radial, brachial, femoral, absent popliteal dorsalis pedis, posterior tibial pulses bilaterally, audible bruit palpable thrill left upper arm AV fistula.  The fistula is not very easily visible on the skin surface. Musculoskeletal: No deformity or edema  Neurologic: Upper and lower extremity motor  5/5 and symmetric  DATA:  I reviewed the patient's prior ABIs which were noncompressible.  Toe pressure was 36 on the right side 17 on the left side this is from a study [facility] dated March 01, 2017.  She had a duplex ultrasound of her lower extremities in our office today.  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  She has mild claudication symptoms.  Left leg is worse than the right.  She currently does not have limb threatening ischemia with rest pain or tissue loss.  The   PLAN: I discussed with the patient today possible intervention to improve her walking distance.  However she wishes to opt for medical management for now.  I did emphasize to her that 75% of people are able to improve her walking distance by walking 30 minutes daily.  I also discussed with her that if she develops any wounds on her feet or develops rest pain in either foot she should come back to see [place] sooner for follow-up.  Otherwise she will see our nurse practitioner and have a repeat toe pressures in 6 months.   Ruta Hinds, MD [facility] Office: [phone] Pager: [phone]

## 2017-04-27 ENCOUNTER — Ambulatory Visit (INDEPENDENT_AMBULATORY_CARE_PROVIDER_SITE_OTHER): Payer: PRIVATE HEALTH INSURANCE | Admitting: "Endocrinology

## 2017-04-27 ENCOUNTER — Encounter: Payer: Self-pay | Admitting: "Endocrinology

## 2017-04-27 VITALS — BP 132/83 | HR 96 | Ht 65.0 in | Wt 177.0 lb

## 2017-04-27 DIAGNOSIS — I10 Essential (primary) hypertension: Secondary | ICD-10-CM

## 2017-04-27 DIAGNOSIS — N186 End stage renal disease: Secondary | ICD-10-CM | POA: Diagnosis not present

## 2017-04-27 DIAGNOSIS — E782 Mixed hyperlipidemia: Secondary | ICD-10-CM

## 2017-04-27 DIAGNOSIS — E1122 Type 2 diabetes mellitus with diabetic chronic kidney disease: Secondary | ICD-10-CM | POA: Diagnosis not present

## 2017-04-27 MED ORDER — FREESTYLE LIBRE 14 DAY SENSOR MISC: 1.0000 | 2 refills | Status: AC

## 2017-04-27 MED ORDER — FREESTYLE LIBRE 14 DAY READER DEVI: 1.0000 | Freq: Once | 0 refills | Status: AC

## 2017-04-27 NOTE — Progress Notes (Signed)
Subjective:    Patient ID: Selena Rowe, female    DOB: [birth year],    Past Medical History:  Diagnosis Date  . Diabetes mellitus, type II ([place])   . Hyperlipidemia   . Hypertension   . Kidney disease    on diaylsis since 2016  . Stroke [facility]) 2014   Past Surgical History:  Procedure Laterality Date  . ABDOMINAL HYSTERECTOMY    . AGILE CAPSULE N/A 09/14/2016   Procedure: AGILE CAPSULE;  Surgeon: Danie Binder, MD;  [facility];  Service: Endoscopy;  Laterality: N/A;  . BIOPSY  09/09/2016   Procedure: BIOPSY;  Surgeon: Danie Binder, MD;  [facility];  Service: Endoscopy;;  duodenal;gastric  . CESAREAN SECTION    . COLONOSCOPY  09/2013   Dr. Earley Brooke: Performed for IDA due to blood loss, normal colonoscopy  . COLONOSCOPY  12/2005   Dr. Earley Brooke: hemorrhoids  . COLONOSCOPY N/A 09/09/2016   Procedure: COLONOSCOPY;  Surgeon: Danie Binder, MD;  [facility];  Service: Endoscopy;  Laterality: N/A;  1015  . DIALYSIS FISTULA CREATION    . ESOPHAGOGASTRODUODENOSCOPY  02/2014   Dr. Earley Brooke: Hiatal hernia, few erosions of the antrum status post biopsy for clotest, small bowel biopsy showed mild chronic inflammation of the duodenum with changes of celiac disease not seen  . ESOPHAGOGASTRODUODENOSCOPY  12/2005   Dr. Alethia Berthold, Hiatal hernia  . ESOPHAGOGASTRODUODENOSCOPY N/A 09/09/2016   Procedure: ESOPHAGOGASTRODUODENOSCOPY (EGD);  Surgeon: Danie Binder, MD;  [facility];  Service: Endoscopy;  Laterality: N/A;  . GIVENS CAPSULE STUDY N/A 09/21/2016   Procedure: GIVENS CAPSULE STUDY;  Surgeon: Danie Binder, MD;  [facility];  Service: Endoscopy;  Laterality: N/A;  7:30am, pt to arrive at 7:00am  . POLYPECTOMY  09/09/2016   Procedure: POLYPECTOMY;  Surgeon: Danie Binder, MD;  [facility];  Service: Endoscopy;;  cecal and hepatic flexure polyp  . TUBAL LIGATION     Social History   Socioeconomic History  .  Marital status: Married    Spouse name: None  . Number of children: None  . Years of education: None  . Highest education level: None  Social Needs  . Financial resource strain: None  . Food insecurity - worry: None  . Food insecurity - inability: None  . Transportation needs - medical: None  . Transportation needs - non-medical: None  Occupational History  . None  Tobacco Use  . Smoking status: Never Smoker  . Smokeless tobacco: Never Used  Substance and Sexual Activity  . Alcohol use: No    Alcohol/week: 0.0 oz  . Drug use: No  . Sexual activity: None  Other Topics Concern  . None  Social History Narrative  . None   Outpatient Encounter Medications as of 04/27/2017  Medication Sig  . acetaminophen (TYLENOL) 500 MG tablet Take 1,000-1,500 mg by mouth at bedtime as needed for moderate pain or headache.  Marland Kitchen aspirin EC 81 MG tablet Take 81 mg by mouth daily.  Marland Kitchen atorvastatin (LIPITOR) 40 MG tablet Take 40 mg by mouth every evening.   . calcitRIOL (ROCALTROL) 0.25 MCG capsule Take 0.25 mcg by mouth every Monday, Wednesday, and Friday.   . carvedilol (COREG) 25 MG tablet Take 25 mg by mouth 2 (two) times daily.  . cinacalcet (SENSIPAR) 30 MG tablet Take 30 mg by mouth daily.  . clopidogrel (PLAVIX) 75 MG tablet Take 75 mg by mouth daily.  Marland Kitchen  Continuous Blood Gluc Receiver (FREESTYLE LIBRE 14 DAY READER) DEVI 1 each by Does not apply route once for 1 dose.  . Continuous Blood Gluc Sensor (FREESTYLE LIBRE 14 DAY SENSOR) MISC Inject 1 each into the skin every 14 (fourteen) days. Use as directed.  . Cyanocobalamin 2500 MCG CHEW Chew by mouth daily.  Marland Kitchen doxazosin (CARDURA) 1 MG tablet Take 1 mg by mouth at bedtime.   . ergocalciferol (VITAMIN D2) 50000 UNITS capsule Take 50,000 Units by mouth every 30 (thirty) days.   Marland Kitchen gabapentin (NEURONTIN) 100 MG capsule Take 100 mg by mouth daily as needed (pain).   . hydrALAZINE (APRESOLINE) 50 MG tablet Take 50 mg by mouth 2 (two) times daily.  .  Insulin Glargine (TOUJEO SOLOSTAR) 300 UNIT/ML SOPN Inject 40 Units into the skin at bedtime.  . insulin lispro (HUMALOG KWIKPEN) 100 UNIT/ML KiwkPen Inject 10-16 Units into the skin 3 (three) times daily before meals.  . Insulin Pen Needle (B-D ULTRAFINE III SHORT PEN) 31G X 8 MM MISC Use as directed 4 x daily  . montelukast (SINGULAIR) 10 MG tablet Take 10 mg by mouth at bedtime.  . multivitamin (RENA-VIT) TABS tablet Take 1 tablet by mouth daily.  . sucroferric oxyhydroxide (VELPHORO) 500 MG chewable tablet Chew 500 mg by mouth 3 (three) times daily with meals.   No facility-administered encounter medications on file as of 04/27/2017.    ALLERGIES: Allergies  Allergen Reactions  . Amlodipine Swelling   VACCINATION STATUS:  There is no immunization history on file for this patient.  Diabetes  She presents for her follow-up diabetic visit. She has type 2 diabetes mellitus. Onset time: She was diagnosed at approximate age of 30 years preceded by history of gestational diabetes in 54. Her disease course has been improving. There are no hypoglycemic associated symptoms. Pertinent negatives for hypoglycemia include no confusion, headaches, pallor or seizures. (She had one episode of hypoglycemia to 40 associated with symptoms including confusion.) Associated symptoms include fatigue. Pertinent negatives for diabetes include no chest pain, no polydipsia and no polyphagia. There are no hypoglycemic complications. Symptoms are improving. Diabetic complications include a CVA, nephropathy and retinopathy. Risk factors for coronary artery disease include dyslipidemia, diabetes mellitus, hypertension and sedentary lifestyle. Current diabetic treatment includes insulin injections. She is compliant with treatment some of the time. Her weight is stable. She is following a generally unhealthy diet. She has had a previous visit with a dietitian. She rarely participates in exercise. Her breakfast blood glucose  range is generally 140-180 mg/dl. Her dinner blood glucose range is generally 140-180 mg/dl. Her overall blood glucose range is 140-180 mg/dl. ( Her most recent A1c is significantly higher at 9.7% compared to 6.2% last visit.) An ACE inhibitor/angiotensin II receptor blocker is being taken. Eye exam is current.  Hypertension  This is a chronic problem. The current episode started more than 1 year ago. The problem is controlled. Pertinent negatives include no chest pain, headaches, palpitations or shortness of breath. Risk factors for coronary artery disease include dyslipidemia, diabetes mellitus and sedentary lifestyle. Past treatments include beta blockers, diuretics and ACE inhibitors. Hypertensive end-organ damage includes CVA and retinopathy.  Hyperlipidemia  This is a chronic problem. The current episode started more than 1 year ago. The problem is uncontrolled. Exacerbating diseases include diabetes. Pertinent negatives include no chest pain, myalgias or shortness of breath. Current antihyperlipidemic treatment includes statins. Risk factors for coronary artery disease include diabetes mellitus, dyslipidemia, hypertension and a sedentary lifestyle.  Review of Systems  Constitutional: Positive for fatigue. Negative for unexpected weight change.  HENT: Negative for trouble swallowing and voice change.   Eyes: Positive for visual disturbance.  Respiratory: Negative for cough, shortness of breath and wheezing.   Cardiovascular: Negative for chest pain, palpitations and leg swelling.  Gastrointestinal: Negative for diarrhea, nausea and vomiting.  Endocrine: Negative for cold intolerance, heat intolerance, polydipsia and polyphagia.  Musculoskeletal: Negative for arthralgias and myalgias.  Skin: Negative for color change, pallor, rash and wound.  Neurological: Negative for seizures and headaches.  Psychiatric/Behavioral: Negative for confusion and suicidal ideas.    Objective:    BP  132/83   Pulse 96   Ht 5\' 5"  (1.651 m)   Wt 177 lb (80.3 kg)   BMI 29.45 kg/m   Wt Readings from Last 3 Encounters:  04/27/17 177 lb (80.3 kg)  04/26/17 176 lb (79.8 kg)  04/12/17 178 lb (80.7 kg)    Physical Exam  Constitutional: She is oriented to person, place, and time. She appears well-developed.  HENT:  Head: Normocephalic and atraumatic.  Eyes: EOM are normal.  Neck: Normal range of motion. Neck supple. No tracheal deviation present. No thyromegaly present.  Cardiovascular: Normal rate and regular rhythm.  Pulmonary/Chest: Effort normal and breath sounds normal.  Abdominal: Soft. Bowel sounds are normal. There is no tenderness. There is no guarding.  Musculoskeletal: Normal range of motion. She exhibits no edema.  Neurological: She is alert and oriented to person, place, and time. She has normal reflexes. No cranial nerve deficit. Coordination normal.  Skin: Skin is warm and dry. No rash noted. No erythema. No pallor.  Psychiatric: She has a normal mood and affect. Judgment normal.    Recent Results (from the past 2160 hour(s))  VAS [place] LOWER EXTREMITY ARTERIAL DUPLEX     Status: None   Collection Time: 04/26/17  9:42 AM  Result Value Ref Range   Left super femoral prox sys PSV -75 cm/s   Left super femoral mid sys PSV -97 cm/s   Left super femoral dist sys PSV -437 cm/s   Right super femoral prox sys PSV -99 cm/s   Right super femoral mid sys PSV -137 cm/s   Right super femoral dist sys PSV -159 cm/s   Left ant tibial distal sys 21 cm/s   left post tibial dist sys -30 cm/s   RIGHT ANT DIST TIBAL SYS PSV 33 cm/s   RIGHT POST TIB DIST SYS 63 cm/s     Assessment & Plan:   1) uncontrolled type 2 diabetes, 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 - Patient has currently uncontrolled symptomatic type 2 DM since  62 years of age. -She showed better engagement in monitoring blood glucose and covering her meals with  insulin.  She does not eat lunch regularly hence skipped lunch time testing and insulin injection.   -She recently came with  higher A1c of 9.7% from 6.2%.    - She admits that he does not follow a routine mealtimes.    Her diabetes is complicated by end-stage renal disease on dialysis , retinopathy, and cerebrovascular accident and patient remains at a high risk for more acute and chronic complications of diabetes which include CAD, CVA, CKD, retinopathy, and neuropathy. These are all discussed in detail with the patient.  - I have counseled the patient on diet management and weight loss, by adopting a carbohydrate restricted/protein rich diet.  -  Suggestion is made for  her to avoid simple carbohydrates  from her diet including Cakes, Sweet Desserts / Pastries, Ice Cream, Soda (diet and regular), Sweet Tea, Candies, Chips, Cookies, Store Bought Juices, Alcohol in Excess of  1-2 drinks a day, Artificial Sweeteners, and "Sugar-free" Products. This will help patient to have stable blood glucose profile and potentially avoid unintended weight gain.  - I encouraged the patient to switch to  unprocessed or minimally processed complex starch and increased protein intake (animal or plant source), fruits, and vegetables.  - Patient is advised to stick to a routine mealtimes to eat 3 meals  a day and avoid unnecessary snacks ( to snack only to correct hypoglycemia).    - I have approached patient with the following individualized plan to manage diabetes and patient agrees:   - Emphasizing the need for strict compliance, I  will continue Toujeo 40 units daily at bedtime, Humalog which will be changed to NovoLog after her next visit to use 10 units 3 times daily before meals for pre-meal blood glucose readings  of 90-150mg /dl, plus patient specific correction dose for unexpected hyperglycemia above 150mg /dl, associated with strict monitoring of glucose  daily before meals and at bedtime. - Patient is warned  not to take insulin without proper monitoring per orders. -Adjustment parameters are given for hypo and hyperglycemia in writing. -Patient is encouraged to call clinic for blood glucose levels less than 70 or above 300 mg /dl.  -Patient is not a candidate for metformin andSGLT2 inhibitors due to CKD.  - Patient specific target  A1c;  LDL, HDL, Triglycerides, and  Waist Circumference were discussed in detail.  2) BP/HTN: Her blood pressure is controlled to target.   I advised her to continue carvedilol 12.5 mg by mouth twice a day lisinopril 10 mg by mouth daily. She is also following with nephrology.   3) Lipids/HPL:  Controlled , LDL at 55.  continue statins. 4)  Weight/Diet: CDE Consult has been  initiated , exercise, and detailed carbohydrates information provided.  5) Chronic Care/Health Maintenance:  -Patient is on ACEI/ARB and Statin medications and encouraged to continue to follow up with Ophthalmology, Podiatrist at least yearly or according to recommendations, and advised to   stay away from smoking. I have recommended yearly flu vaccine and pneumonia vaccination at least every 5 years; moderate intensity exercise for up to 150 minutes weekly; and  sleep for at least 7 hours a day.  I advised patient to maintain close follow up with her PCP for primary care needs.  - Time spent with the patient: 25 min, of which >50% was spent in reviewing her blood glucose logs , discussing her hypo- and hyper-glycemic episodes, reviewing her current and  previous labs and insulin doses and developing a plan to avoid hypo- and hyper-glycemia. Please refer to Patient Instructions for Blood Glucose Monitoring and Insulin/Medications Dosing Guide"  in media tab for additional information.   Follow up plan: Return in about 8 weeks (around 06/22/2017) for meter, and logs.  Glade Lloyd, MD Phone: [phone]  Fax: [phone]  -  This note was partially dictated with voice recognition software. Similar  sounding words can be transcribed inadequately or may not  be corrected upon review.  04/27/2017, 5:50 PM

## 2017-04-27 NOTE — Patient Instructions (Signed)

## 2017-04-27 NOTE — Addendum Note (Signed)
Addended by: Lianne Cure A on: 04/27/2017 04:04 PM   Modules accepted: Orders

## 2017-05-26 ENCOUNTER — Ambulatory Visit (INDEPENDENT_AMBULATORY_CARE_PROVIDER_SITE_OTHER): Payer: PRIVATE HEALTH INSURANCE | Admitting: "Endocrinology

## 2017-05-26 DIAGNOSIS — E1122 Type 2 diabetes mellitus with diabetic chronic kidney disease: Secondary | ICD-10-CM

## 2017-05-26 DIAGNOSIS — N186 End stage renal disease: Secondary | ICD-10-CM | POA: Diagnosis not present

## 2017-05-28 NOTE — Patient Instructions (Signed)
Pt instructed to call if she has any questions or concerns on how to use the [employer].

## 2017-05-28 NOTE — Progress Notes (Signed)
Pt brings in hernewFreestyle Libretoday. She has the 14 day reader and sensors. Went over instructions on how to use the Libre system. Also went over instruction on how to properly apply the sensor to her arm. Libre sensor was placed on the back of pts upper right arm. She voices understanding on how to use the system and how to get BG reading for insulin injections and as needed. She was advised to contact the office if he has any questions or concerns. 

## 2017-06-21 LAB — COMPLETE METABOLIC PANEL WITH GFR
AG Ratio: 1.1 (calc) (ref 1.0–2.5)
ALKALINE PHOSPHATASE (APISO): 149 U/L — AB (ref 33–130)
ALT: 13 U/L (ref 6–29)
AST: 13 U/L (ref 10–35)
Albumin: 3.4 g/dL — ABNORMAL LOW (ref 3.6–5.1)
BILIRUBIN TOTAL: 0.3 mg/dL (ref 0.2–1.2)
BUN / CREAT RATIO: 3 (calc) — AB (ref 6–22)
BUN: 40 mg/dL — ABNORMAL HIGH (ref 7–25)
CHLORIDE: 93 mmol/L — AB (ref 98–110)
CO2: 28 mmol/L (ref 20–32)
CREATININE: 14.88 mg/dL — AB (ref 0.50–0.99)
Calcium: 9.1 mg/dL (ref 8.6–10.4)
GFR, Est African American: <4 mL/min/{1.73_m2} — ABNORMAL LOW (ref >=60)
GFR, Est Non African American: 2 mL/min/{1.73_m2} — ABNORMAL LOW (ref >=60)
GLOBULIN: 3.1 g/dL (ref 1.9–3.7)
GLUCOSE: 179 mg/dL — AB (ref 65–99)
Potassium: 3.6 mmol/L (ref 3.5–5.3)
SODIUM: 136 mmol/L (ref 135–146)
Total Protein: 6.5 g/dL (ref 6.1–8.1)

## 2017-06-22 LAB — HEMOGLOBIN A1C
HEMOGLOBIN A1C: 9.8 %{Hb} — AB (ref <5.7)
Mean Plasma Glucose: 235 (calc)
eAG (mmol/L): 13 (calc)

## 2017-06-24 ENCOUNTER — Encounter: Payer: Self-pay | Admitting: "Endocrinology

## 2017-06-24 ENCOUNTER — Ambulatory Visit (INDEPENDENT_AMBULATORY_CARE_PROVIDER_SITE_OTHER): Payer: Medicare Other | Admitting: "Endocrinology

## 2017-06-24 VITALS — BP 130/71 | HR 61 | Ht 65.0 in | Wt 173.0 lb

## 2017-06-24 DIAGNOSIS — I10 Essential (primary) hypertension: Secondary | ICD-10-CM

## 2017-06-24 DIAGNOSIS — N186 End stage renal disease: Secondary | ICD-10-CM

## 2017-06-24 DIAGNOSIS — E782 Mixed hyperlipidemia: Secondary | ICD-10-CM

## 2017-06-24 DIAGNOSIS — E1122 Type 2 diabetes mellitus with diabetic chronic kidney disease: Secondary | ICD-10-CM

## 2017-06-24 NOTE — Patient Instructions (Signed)

## 2017-06-24 NOTE — Progress Notes (Signed)
Subjective:    Patient ID: Selena Rowe, female    DOB: [birth year],    Past Medical History:  Diagnosis Date  . Diabetes mellitus, type II ([place])   . Hyperlipidemia   . Hypertension   . Kidney disease    on diaylsis since 2016  . Stroke [facility]) 2014   Past Surgical History:  Procedure Laterality Date  . ABDOMINAL HYSTERECTOMY    . AGILE CAPSULE N/A 09/14/2016   Procedure: AGILE CAPSULE;  Surgeon: Danie Binder, MD;  [facility]: [facility];  Service: Endoscopy;  Laterality: N/A;  . BIOPSY  09/09/2016   Procedure: BIOPSY;  Surgeon: Danie Binder, MD;  [facility];  Service: Endoscopy;;  duodenal;gastric  . CESAREAN SECTION    . COLONOSCOPY  09/2013   Dr. Earley Brooke: Performed for IDA due to blood loss, normal colonoscopy  . COLONOSCOPY  12/2005   Dr. Earley Brooke: hemorrhoids  . COLONOSCOPY N/A 09/09/2016   Procedure: COLONOSCOPY;  Surgeon: Danie Binder, MD;  [facility]: [facility];  Service: Endoscopy;  Laterality: N/A;  1015  . DIALYSIS FISTULA CREATION    . ESOPHAGOGASTRODUODENOSCOPY  02/2014   Dr. Earley Brooke: Hiatal hernia, few erosions of the antrum status post biopsy for clotest, small bowel biopsy showed mild chronic inflammation of the duodenum with changes of celiac disease not seen  . ESOPHAGOGASTRODUODENOSCOPY  12/2005   Dr. Alethia Berthold, Hiatal hernia  . ESOPHAGOGASTRODUODENOSCOPY N/A 09/09/2016   Procedure: ESOPHAGOGASTRODUODENOSCOPY (EGD);  Surgeon: Danie Binder, MD;  [facility];  Service: Endoscopy;  Laterality: N/A;  . GIVENS CAPSULE STUDY N/A 09/21/2016   Procedure: GIVENS CAPSULE STUDY;  Surgeon: Danie Binder, MD;  [facility];  Service: Endoscopy;  Laterality: N/A;  7:30am, pt to arrive at 7:00am  . POLYPECTOMY  09/09/2016   Procedure: POLYPECTOMY;  Surgeon: Danie Binder, MD;  [facility];  Service: Endoscopy;;  cecal and hepatic flexure polyp  . TUBAL LIGATION     Social History   Socioeconomic History  .  Marital status: Married    Spouse name: Not on file  . Number of children: Not on file  . Years of education: Not on file  . Highest education level: Not on file  Occupational History  . Not on file  Social Needs  . Financial resource strain: Not on file  . Food insecurity:    Worry: Not on file    Inability: Not on file  . Transportation needs:    Medical: Not on file    Non-medical: Not on file  Tobacco Use  . Smoking status: Never Smoker  . Smokeless tobacco: Never Used  Substance and Sexual Activity  . Alcohol use: No    Alcohol/week: 0.0 oz  . Drug use: No  . Sexual activity: Not on file  Lifestyle  . Physical activity:    Days per week: Not on file    Minutes per session: Not on file  . Stress: Not on file  Relationships  . Social connections:    Talks on phone: Not on file    Gets together: Not on file    Attends religious service: Not on file    Active member of club or organization: Not on file    Attends meetings of clubs or organizations: Not on file    Relationship status: Not on file  Other Topics Concern  . Not on file  Social History Narrative  . Not on file  Outpatient Encounter Medications as of 06/24/2017  Medication Sig  . acetaminophen (TYLENOL) 500 MG tablet Take 1,000-1,500 mg by mouth at bedtime as needed for moderate pain or headache.  Marland Kitchen aspirin EC 81 MG tablet Take 81 mg by mouth daily.  Marland Kitchen atorvastatin (LIPITOR) 40 MG tablet Take 40 mg by mouth every evening.   . calcitRIOL (ROCALTROL) 0.25 MCG capsule Take 0.25 mcg by mouth every Monday, Wednesday, and Friday.   . carvedilol (COREG) 25 MG tablet Take 25 mg by mouth 2 (two) times daily.  . cinacalcet (SENSIPAR) 30 MG tablet Take 30 mg by mouth daily.  . clopidogrel (PLAVIX) 75 MG tablet Take 75 mg by mouth daily.  . Continuous Blood Gluc Sensor (FREESTYLE LIBRE 14 DAY SENSOR) MISC Inject 1 each into the skin every 14 (fourteen) days. Use as directed.  . Cyanocobalamin 2500 MCG CHEW Chew by  mouth daily.  Marland Kitchen doxazosin (CARDURA) 1 MG tablet Take 1 mg by mouth at bedtime.   . ergocalciferol (VITAMIN D2) 50000 UNITS capsule Take 50,000 Units by mouth every 30 (thirty) days.   Marland Kitchen gabapentin (NEURONTIN) 100 MG capsule Take 100 mg by mouth daily as needed (pain).   . hydrALAZINE (APRESOLINE) 50 MG tablet Take 50 mg by mouth 2 (two) times daily.  . Insulin Glargine (TOUJEO SOLOSTAR) 300 UNIT/ML SOPN Inject 40 Units into the skin at bedtime.  . insulin lispro (HUMALOG KWIKPEN) 100 UNIT/ML KiwkPen Inject 10-16 Units into the skin 3 (three) times daily before meals.  . Insulin Pen Needle (B-D ULTRAFINE III SHORT PEN) 31G X 8 MM MISC Use as directed 4 x daily  . montelukast (SINGULAIR) 10 MG tablet Take 10 mg by mouth at bedtime.  . multivitamin (RENA-VIT) TABS tablet Take 1 tablet by mouth daily.  . sucroferric oxyhydroxide (VELPHORO) 500 MG chewable tablet Chew 500 mg by mouth 3 (three) times daily with meals.   No facility-administered encounter medications on file as of 06/24/2017.    ALLERGIES: Allergies  Allergen Reactions  . Amlodipine Swelling   VACCINATION STATUS:  There is no immunization history on file for this patient.  Diabetes  She presents for her follow-up diabetic visit. She has type 2 diabetes mellitus. Onset time: She was diagnosed at approximate age of 1 years preceded by history of gestational diabetes in 26. Her disease course has been stable. There are no hypoglycemic associated symptoms. Pertinent negatives for hypoglycemia include no confusion, headaches, pallor or seizures. Associated symptoms include weight loss. Pertinent negatives for diabetes include no chest pain, no fatigue, no polydipsia and no polyphagia. There are no hypoglycemic complications. Symptoms are stable. Diabetic complications include a CVA, nephropathy and retinopathy. Risk factors for coronary artery disease include dyslipidemia, diabetes mellitus, hypertension and sedentary lifestyle. Current  diabetic treatment includes insulin injections. She is compliant with treatment most of the time. Her weight is fluctuating minimally. She is following a generally unhealthy diet. She has had a previous visit with a dietitian. She rarely participates in exercise. Her breakfast blood glucose range is generally 140-180 mg/dl. Her lunch blood glucose range is generally 140-180 mg/dl. Her dinner blood glucose range is generally 140-180 mg/dl. Her bedtime blood glucose range is generally 140-180 mg/dl. Her overall blood glucose range is 140-180 mg/dl. (She returns with her CGM device showing average blood glucose of 179, 57% time in range, 43% above target, 0% hypoglycemia.  She has above target readings mainly in the evening hours until 6 AM related to her overnight peritoneal dialysis.) An ACE  inhibitor/angiotensin II receptor blocker is being taken. Eye exam is current.  Hypertension  This is a chronic problem. The current episode started more than 1 year ago. The problem is controlled. Pertinent negatives include no chest pain, headaches, palpitations or shortness of breath. Risk factors for coronary artery disease include dyslipidemia, diabetes mellitus, sedentary lifestyle and post-menopausal state. Past treatments include beta blockers, diuretics and ACE inhibitors. Hypertensive end-organ damage includes CVA and retinopathy.  Hyperlipidemia  This is a chronic problem. The current episode started more than 1 year ago. The problem is controlled. Recent lipid tests were reviewed and are variable. Exacerbating diseases include diabetes. Pertinent negatives include no chest pain, myalgias or shortness of breath. Current antihyperlipidemic treatment includes statins. Risk factors for coronary artery disease include diabetes mellitus, dyslipidemia, hypertension, a sedentary lifestyle and post-menopausal.     Review of Systems  Constitutional: Positive for weight loss. Negative for fatigue and unexpected weight  change.  HENT: Negative for trouble swallowing and voice change.   Eyes: Positive for visual disturbance.  Respiratory: Negative for cough, shortness of breath and wheezing.   Cardiovascular: Negative for chest pain, palpitations and leg swelling.  Gastrointestinal: Negative for diarrhea, nausea and vomiting.  Endocrine: Negative for cold intolerance, heat intolerance, polydipsia and polyphagia.  Musculoskeletal: Negative for arthralgias and myalgias.  Skin: Negative for color change, pallor, rash and wound.  Neurological: Negative for seizures and headaches.  Psychiatric/Behavioral: Negative for confusion and suicidal ideas.    Objective:    BP 130/71   Pulse 61   Ht 5\' 5"  (1.651 m)   Wt 173 lb (78.5 kg)   BMI 28.79 kg/m   Wt Readings from Last 3 Encounters:  06/24/17 173 lb (78.5 kg)  05/28/17 177 lb (80.3 kg)  04/27/17 177 lb (80.3 kg)    Physical Exam  Constitutional: She is oriented to person, place, and time. She appears well-developed.  HENT:  Head: Normocephalic and atraumatic.  Eyes: EOM are normal.  Neck: Normal range of motion. Neck supple. No tracheal deviation present. No thyromegaly present.  Cardiovascular: Normal rate and regular rhythm.  Pulmonary/Chest: Effort normal.  Abdominal: Bowel sounds are normal. There is no tenderness. There is no guarding.  Musculoskeletal: Normal range of motion. She exhibits no edema.  Neurological: She is alert and oriented to person, place, and time. She has normal reflexes. No cranial nerve deficit. Coordination normal.  Skin: Skin is warm and dry. No rash noted. No erythema. No pallor.  Psychiatric: She has a normal mood and affect. Judgment normal.    Recent Results (from the past 2160 hour(s))  VAS [place] LOWER EXTREMITY ARTERIAL DUPLEX     Status: None   Collection Time: 04/26/17  9:42 AM  Result Value Ref Range   Left super femoral prox sys PSV -75 cm/s   Left super femoral mid sys PSV -97 cm/s   Left super femoral dist  sys PSV -437 cm/s   Right super femoral prox sys PSV -99 cm/s   Right super femoral mid sys PSV -137 cm/s   Right super femoral dist sys PSV -159 cm/s   Left ant tibial distal sys 21 cm/s   left post tibial dist sys -30 cm/s   RIGHT ANT DIST TIBAL SYS PSV 33 cm/s   RIGHT POST TIB DIST SYS 63 cm/s  COMPLETE METABOLIC PANEL WITH GFR     Status: Abnormal   Collection Time: 06/21/17  8:58 AM  Result Value Ref Range   Glucose, Bld 179 (H) 65 -  99 mg/dL    Comment: .            Fasting reference interval . For someone without known diabetes, a glucose value >125 mg/dL indicates that they may have diabetes and this should be confirmed with a follow-up test. .    BUN 40 (H) 7 - 25 mg/dL   Creat 14.88 (H) 0.50 - 0.99 mg/dL    Comment: Verified by repeat analysis. . For patients >44 years of age, the reference limit for Creatinine is approximately 13% higher for people identified as African-American. .    GFR, Est Non African American 2 (L) > OR = 60 mL/min/1.32m2   GFR, Est African American <4 (L) > OR = 60 mL/min/1.64m2   BUN/Creatinine Ratio 3 (L) 6 - 22 (calc)   Sodium 136 135 - 146 mmol/L   Potassium 3.6 3.5 - 5.3 mmol/L   Chloride 93 (L) 98 - 110 mmol/L   CO2 28 20 - 32 mmol/L   Calcium 9.1 8.6 - 10.4 mg/dL   Total Protein 6.5 6.1 - 8.1 g/dL   Albumin 3.4 (L) 3.6 - 5.1 g/dL   Globulin 3.1 1.9 - 3.7 g/dL (calc)   AG Ratio 1.1 1.0 - 2.5 (calc)   Total Bilirubin 0.3 0.2 - 1.2 mg/dL   Alkaline phosphatase (APISO) 149 (H) 33 - 130 U/L   AST 13 10 - 35 U/L   ALT 13 6 - 29 U/L  Hemoglobin A1c     Status: Abnormal   Collection Time: 06/21/17  8:58 AM  Result Value Ref Range   Hgb A1c MFr Bld 9.8 (H) <5.7 % of total Hgb    Comment: For someone without known diabetes, a hemoglobin A1c value of 6.5% or greater indicates that they may have  diabetes and this should be confirmed with a follow-up  test. . For someone with known diabetes, a value <7% indicates  that their  diabetes is well controlled and a value  greater than or equal to 7% indicates suboptimal  control. A1c targets should be individualized based on  duration of diabetes, age, comorbid conditions, and  other considerations. . Currently, no consensus exists regarding use of hemoglobin A1c for diagnosis of diabetes for children. .    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 - Patient has currently uncontrolled symptomatic type 2 DM since  62 years of age. -She is currently using the continuous glucose monitoring device with evident better engagement in monitoring blood glucose and covering her meals with insulin.  -Her labs show A1c of 9.8%, against average blood glucose of 179 on her Elenor Legato for the last 3 months, likely discrepancy related to her end-stage renal disease.     Her diabetes is complicated by end-stage renal disease on dialysis , retinopathy, and cerebrovascular accident and patient remains at a high risk for more acute and chronic complications of diabetes which include CAD, CVA, CKD, retinopathy, and neuropathy. These are all discussed in detail with the patient.  - I have counseled the patient on  diet management and weight loss, by adopting a carbohydrate restricted/protein rich diet.  -  Suggestion is made for her to avoid simple carbohydrates  from her diet including Cakes, Sweet Desserts / Pastries, Ice Cream, Soda (diet and regular), Sweet Tea, Candies, Chips, Cookies, Store Bought Juices, Alcohol in Excess of  1-2 drinks a day, Artificial Sweeteners, and "Sugar-free" Products.  This will help patient to have stable blood glucose profile and potentially avoid unintended weight gain.   - I encouraged the patient to switch to  unprocessed or minimally processed complex starch and increased protein intake (animal or plant source), fruits, and vegetables.  - Patient is advised to stick to a routine mealtimes to eat 3 meals  a day and avoid unnecessary snacks ( to snack only to correct hypoglycemia).    - I have approached patient with the following individualized plan to manage diabetes and patient agrees:   - Emphasizing the need for strict compliance, I advised her to increase Toujeo to 44 units nightly, continue Humalog  10 units 3 times daily before meals for pre-meal blood glucose readings  of 90-150mg /dl, plus patient specific correction dose for unexpected hyperglycemia above 150mg /dl, associated with strict monitoring of glucose  daily before meals and at bedtime. - Patient is warned not to take insulin without proper monitoring per orders. -Adjustment parameters are given for hypo and hyperglycemia in writing. -Patient is encouraged to call clinic for blood glucose levels less than 70 or above 300 mg /dl. -She is benefiting from the continuous glucose monitoring device.  She is advised to wear it at all times.  -Patient is not a candidate for metformin andSGLT2 inhibitors due to CKD.  - Patient specific target  A1c;  LDL, HDL, Triglycerides, and  Waist Circumference were discussed in detail.  2) BP/HTN: Her blood pressure is controlled to target.  I advised her to continue her current blood pressure medications including carvedilol 12.5 mg p.o. twice daily, lisinopril 10 mg p.o. daily.  She is also following with nephrology.   3) Lipids/HPL: Her recent lipid panel showed controlled LDL of 55.   She is advised to continue atorvastatin 40 mill grams p.o. nightly.   4)  Weight/Diet: CDE Consult has been  initiated , exercise, and detailed carbohydrates information  provided.  5) Chronic Care/Health Maintenance:  -Patient is on ACEI/ARB and Statin medications and encouraged to continue to follow up with Ophthalmology, Podiatrist at least yearly or according to recommendations, and advised to   stay away from smoking. I have recommended yearly flu vaccine and pneumonia vaccination at least every 5 years; moderate intensity exercise for up to 150 minutes weekly; and  sleep for at least 7 hours a day.  I advised patient to maintain close follow up with her PCP for primary care needs. - Time spent with the patient: 25 min, of which >50% was spent in reviewing her blood glucose logs , discussing her hypo- and hyper-glycemic episodes, reviewing her current and  previous labs and insulin doses and developing a plan to avoid hypo- and hyper-glycemia. Please refer to Patient Instructions for Blood Glucose Monitoring and Insulin/Medications Dosing Guide"  in media tab for additional information. Selena Rowe participated in the discussions, expressed understanding, and voiced agreement with the above plans.  All questions were answered to her satisfaction. she is encouraged to contact clinic should she have any questions or concerns prior to her return visit.  Follow up plan: Return in about 3 months (around 09/24/2017) for follow up  with pre-visit labs, meter, and logs.  Glade Lloyd, MD Phone: [phone]  Fax: [phone]  -  This note was partially dictated with voice recognition software. Similar sounding words can be transcribed inadequately or may not  be corrected upon review.  06/24/2017, 10:38 AM

## 2017-09-18 LAB — LIPID PANEL
CHOL/HDL RATIO: 3.9 (calc) (ref <5.0)
CHOLESTEROL: 126 mg/dL (ref <200)
HDL: 32 mg/dL — AB (ref >50)
LDL Cholesterol (Calc): 63 mg/dL (calc)
NON-HDL CHOLESTEROL (CALC): 94 mg/dL (ref <130)
Triglycerides: 262 mg/dL — ABNORMAL HIGH (ref <150)

## 2017-09-18 LAB — TSH: TSH: 1.12 m[IU]/L (ref 0.40–4.50)

## 2017-09-18 LAB — HEMOGLOBIN A1C
EAG (MMOL/L): 8.1 (calc)
Hgb A1c MFr Bld: 6.7 % of total Hgb — ABNORMAL HIGH (ref <5.7)
MEAN PLASMA GLUCOSE: 146 (calc)

## 2017-09-18 LAB — T4, FREE: Free T4: 0.8 ng/dL (ref 0.8–1.8)

## 2017-09-24 ENCOUNTER — Ambulatory Visit: Payer: PRIVATE HEALTH INSURANCE | Admitting: "Endocrinology

## 2017-10-12 ENCOUNTER — Encounter: Payer: Self-pay | Admitting: "Endocrinology

## 2017-10-12 ENCOUNTER — Ambulatory Visit (INDEPENDENT_AMBULATORY_CARE_PROVIDER_SITE_OTHER): Payer: Medicare Other | Admitting: "Endocrinology

## 2017-10-12 VITALS — BP 138/70 | HR 87 | Ht 65.0 in | Wt 163.0 lb

## 2017-10-12 DIAGNOSIS — E1122 Type 2 diabetes mellitus with diabetic chronic kidney disease: Secondary | ICD-10-CM

## 2017-10-12 DIAGNOSIS — I10 Essential (primary) hypertension: Secondary | ICD-10-CM | POA: Diagnosis not present

## 2017-10-12 DIAGNOSIS — E782 Mixed hyperlipidemia: Secondary | ICD-10-CM | POA: Diagnosis not present

## 2017-10-12 DIAGNOSIS — N186 End stage renal disease: Secondary | ICD-10-CM | POA: Diagnosis not present

## 2017-10-12 MED ORDER — INSULIN GLARGINE 300 UNIT/ML ~~LOC~~ SOPN: 20.0000 [IU] | PEN_INJECTOR | Freq: Every day | SUBCUTANEOUS | 2 refills | Status: DC

## 2017-10-12 NOTE — Patient Instructions (Signed)

## 2017-10-12 NOTE — Progress Notes (Signed)
Endocrinology follow-up note   Subjective:    Patient ID: Selena Rowe, female    DOB: 06/26/1955,    Past Medical History:  Diagnosis Date  . Diabetes mellitus, type II ([place])   . Hyperlipidemia   . Hypertension   . Kidney disease    on diaylsis since 2016  . Stroke [facility]) 2014   Past Surgical History:  Procedure Laterality Date  . ABDOMINAL HYSTERECTOMY    . AGILE CAPSULE N/A 09/14/2016   Procedure: AGILE CAPSULE;  Surgeon: Danie Binder, MD;  [facility]: [facility];  Service: Endoscopy;  Laterality: N/A;  . BIOPSY  09/09/2016   Procedure: BIOPSY;  Surgeon: Danie Binder, MD;  [facility];  Service: Endoscopy;;  duodenal;gastric  . CESAREAN SECTION    . COLONOSCOPY  09/2013   Dr. Earley Brooke: Performed for IDA due to blood loss, normal colonoscopy  . COLONOSCOPY  12/2005   Dr. Earley Brooke: hemorrhoids  . COLONOSCOPY N/A 09/09/2016   Procedure: COLONOSCOPY;  Surgeon: Danie Binder, MD;  [facility]: [facility];  Service: Endoscopy;  Laterality: N/A;  1015  . DIALYSIS FISTULA CREATION    . ESOPHAGOGASTRODUODENOSCOPY  02/2014   Dr. Earley Brooke: Hiatal hernia, few erosions of the antrum status post biopsy for clotest, small bowel biopsy showed mild chronic inflammation of the duodenum with changes of celiac disease not seen  . ESOPHAGOGASTRODUODENOSCOPY  12/2005   Dr. Alethia Berthold, Hiatal hernia  . ESOPHAGOGASTRODUODENOSCOPY N/A 09/09/2016   Procedure: ESOPHAGOGASTRODUODENOSCOPY (EGD);  Surgeon: Danie Binder, MD;  [facility];  Service: Endoscopy;  Laterality: N/A;  . GIVENS CAPSULE STUDY N/A 09/21/2016   Procedure: GIVENS CAPSULE STUDY;  Surgeon: Danie Binder, MD;  [facility];  Service: Endoscopy;  Laterality: N/A;  7:30am, pt to arrive at 7:00am  . POLYPECTOMY  09/09/2016   Procedure: POLYPECTOMY;  Surgeon: Danie Binder, MD;  [facility];  Service: Endoscopy;;  cecal and hepatic flexure polyp  . TUBAL LIGATION     Social History    Socioeconomic History  . Marital status: Married    Spouse name: Not on file  . Number of children: Not on file  . Years of education: Not on file  . Highest education level: Not on file  Occupational History  . Not on file  Social Needs  . Financial resource strain: Not on file  . Food insecurity:    Worry: Not on file    Inability: Not on file  . Transportation needs:    Medical: Not on file    Non-medical: Not on file  Tobacco Use  . Smoking status: Never Smoker  . Smokeless tobacco: Never Used  Substance and Sexual Activity  . Alcohol use: No    Alcohol/week: 0.0 standard drinks  . Drug use: No  . Sexual activity: Not on file  Lifestyle  . Physical activity:    Days per week: Not on file    Minutes per session: Not on file  . Stress: Not on file  Relationships  . Social connections:    Talks on phone: Not on file    Gets together: Not on file    Attends religious service: Not on file    Active member of club or organization: Not on file    Attends meetings of clubs or organizations: Not on file    Relationship status: Not on file  Other Topics Concern  . Not on file  Social History Narrative  .  Not on file   Outpatient Encounter Medications as of 10/12/2017  Medication Sig  . acetaminophen (TYLENOL) 500 MG tablet Take 1,000-1,500 mg by mouth at bedtime as needed for moderate pain or headache.  Marland Kitchen aspirin EC 81 MG tablet Take 81 mg by mouth daily.  Marland Kitchen atorvastatin (LIPITOR) 40 MG tablet Take 40 mg by mouth every evening.   . calcitRIOL (ROCALTROL) 0.25 MCG capsule Take 0.25 mcg by mouth every Monday, Wednesday, and Friday.   . carvedilol (COREG) 25 MG tablet Take 25 mg by mouth 2 (two) times daily.  . cinacalcet (SENSIPAR) 30 MG tablet Take 30 mg by mouth daily.  . clopidogrel (PLAVIX) 75 MG tablet Take 75 mg by mouth daily.  . Continuous Blood Gluc Sensor (FREESTYLE LIBRE 14 DAY SENSOR) MISC Inject 1 each into the skin every 14 (fourteen) days. Use as directed.   . Cyanocobalamin 2500 MCG CHEW Chew by mouth daily.  Marland Kitchen doxazosin (CARDURA) 1 MG tablet Take 1 mg by mouth at bedtime.   . ergocalciferol (VITAMIN D2) 50000 UNITS capsule Take 50,000 Units by mouth every 30 (thirty) days.   Marland Kitchen gabapentin (NEURONTIN) 100 MG capsule Take 100 mg by mouth daily as needed (pain).   . hydrALAZINE (APRESOLINE) 50 MG tablet Take 50 mg by mouth 2 (two) times daily.  . Insulin Glargine (TOUJEO SOLOSTAR) 300 UNIT/ML SOPN Inject 20 Units into the skin at bedtime.  . insulin lispro (HUMALOG KWIKPEN) 100 UNIT/ML KiwkPen Inject 5-11 Units into the skin 3 (three) times daily before meals.  . Insulin Pen Needle (B-D ULTRAFINE III SHORT PEN) 31G X 8 MM MISC Use as directed 4 x daily  . montelukast (SINGULAIR) 10 MG tablet Take 10 mg by mouth at bedtime.  . multivitamin (RENA-VIT) TABS tablet Take 1 tablet by mouth daily.  . sucroferric oxyhydroxide (VELPHORO) 500 MG chewable tablet Chew 500 mg by mouth 3 (three) times daily with meals.  . [DISCONTINUED] Insulin Glargine (TOUJEO SOLOSTAR) 300 UNIT/ML SOPN Inject 40 Units into the skin at bedtime.   No facility-administered encounter medications on file as of 10/12/2017.    ALLERGIES: Allergies  Allergen Reactions  . Amlodipine Swelling   VACCINATION STATUS:  There is no immunization history on file for this patient.  Diabetes  She presents for her follow-up diabetic visit. She has type 2 diabetes mellitus. Onset time: She was diagnosed at approximate age of 46 years preceded by history of gestational diabetes in 10. Her disease course has been improving. There are no hypoglycemic associated symptoms. Pertinent negatives for hypoglycemia include no confusion, headaches, pallor or seizures. Associated symptoms include weight loss. Pertinent negatives for diabetes include no chest pain, no fatigue, no polydipsia and no polyphagia. There are no hypoglycemic complications. Symptoms are improving. Diabetic complications include a  CVA, nephropathy and retinopathy. Risk factors for coronary artery disease include dyslipidemia, diabetes mellitus, hypertension and sedentary lifestyle. Current diabetic treatment includes insulin injections. She is compliant with treatment most of the time. Her weight is decreasing steadily. She is following a generally unhealthy diet. She has had a previous visit with a dietitian. She rarely participates in exercise. Her breakfast blood glucose range is generally 130-140 mg/dl. Her lunch blood glucose range is generally 140-180 mg/dl. Her dinner blood glucose range is generally 140-180 mg/dl. Her bedtime blood glucose range is generally 140-180 mg/dl. Her overall blood glucose range is 140-180 mg/dl. (She returns with her CGM device showing average blood glucose of 179, 57% time in range, 43% above target, 0%  hypoglycemia.  She has above target readings mainly in the evening hours until 6 AM related to her overnight peritoneal dialysis.) An ACE inhibitor/angiotensin II receptor blocker is being taken. Eye exam is current.  Hypertension  This is a chronic problem. The current episode started more than 1 year ago. The problem is controlled. Pertinent negatives include no chest pain, headaches, palpitations or shortness of breath. Risk factors for coronary artery disease include dyslipidemia, diabetes mellitus, sedentary lifestyle and post-menopausal state. Past treatments include beta blockers, diuretics and ACE inhibitors. Hypertensive end-organ damage includes CVA and retinopathy.  Hyperlipidemia  This is a chronic problem. The current episode started more than 1 year ago. The problem is controlled. Recent lipid tests were reviewed and are variable. Exacerbating diseases include diabetes. Pertinent negatives include no chest pain, myalgias or shortness of breath. Current antihyperlipidemic treatment includes statins. Risk factors for coronary artery disease include diabetes mellitus, dyslipidemia,  hypertension, a sedentary lifestyle and post-menopausal.     Review of Systems  Constitutional: Positive for weight loss. Negative for fatigue and unexpected weight change.  HENT: Negative for trouble swallowing and voice change.   Eyes: Positive for visual disturbance.  Respiratory: Negative for cough, shortness of breath and wheezing.   Cardiovascular: Negative for chest pain, palpitations and leg swelling.  Gastrointestinal: Negative for diarrhea, nausea and vomiting.  Endocrine: Negative for cold intolerance, heat intolerance, polydipsia and polyphagia.  Musculoskeletal: Negative for arthralgias and myalgias.  Skin: Negative for color change, pallor, rash and wound.  Neurological: Negative for seizures and headaches.  Psychiatric/Behavioral: Negative for confusion and suicidal ideas.    Objective:    BP 138/70   Pulse 87   Ht 5\' 5"  (1.651 m)   Wt 163 lb (73.9 kg)   BMI 27.12 kg/m   Wt Readings from Last 3 Encounters:  10/12/17 163 lb (73.9 kg)  06/24/17 173 lb (78.5 kg)  05/28/17 177 lb (80.3 kg)    Physical Exam  Constitutional: She is oriented to person, place, and time. She appears well-developed.  HENT:  Head: Normocephalic and atraumatic.  Eyes: EOM are normal.  Neck: Normal range of motion. Neck supple. No tracheal deviation present. No thyromegaly present.  Cardiovascular: Normal rate and regular rhythm.  Pulmonary/Chest: Effort normal.  Abdominal: Bowel sounds are normal. There is no tenderness. There is no guarding.  Musculoskeletal: Normal range of motion. She exhibits no edema.  Neurological: She is alert and oriented to person, place, and time. She has normal reflexes. No cranial nerve deficit. Coordination normal.  Skin: Skin is warm and dry. No rash noted. No erythema. No pallor.  Psychiatric: She has a normal mood and affect. Judgment normal.    Recent Results (from the past 2160 hour(s))  Hemoglobin A1c     Status: Abnormal   Collection Time:  09/17/17  8:47 AM  Result Value Ref Range   Hgb A1c MFr Bld 6.7 (H) <5.7 % of total Hgb    Comment: For someone without known diabetes, a hemoglobin A1c value of 6.5% or greater indicates that they may have  diabetes and this should be confirmed with a follow-up  test. . For someone with known diabetes, a value <7% indicates  that their diabetes is well controlled and a value  greater than or equal to 7% indicates suboptimal  control. A1c targets should be individualized based on  duration of diabetes, age, comorbid conditions, and  other considerations. . Currently, no consensus exists regarding use of hemoglobin A1c for diagnosis of diabetes  for children. .    Mean Plasma Glucose 146 (calc)   eAG (mmol/L) 8.1 (calc)  TSH     Status: None   Collection Time: 09/17/17  8:47 AM  Result Value Ref Range   TSH 1.12 0.40 - 4.50 mIU/L  T4, free     Status: None   Collection Time: 09/17/17  8:47 AM  Result Value Ref Range   Free T4 0.8 0.8 - 1.8 ng/dL  Lipid panel     Status: Abnormal   Collection Time: 09/17/17  8:47 AM  Result Value Ref Range   Cholesterol 126 <200 mg/dL   HDL 32 (L) >50 mg/dL   Triglycerides 262 (H) <150 mg/dL    Comment: . If a non-fasting specimen was collected, consider repeat triglyceride testing on a fasting specimen if clinically indicated.  Yates Decamp et al. J. of Clin. Lipidol. 6578;4:696-295. Marland Kitchen    LDL Cholesterol (Calc) 63 mg/dL (calc)    Comment: Reference range: <100 . Desirable range <100 mg/dL for primary prevention;   <70 mg/dL for patients with CHD or diabetic patients  with > or = 2 CHD risk factors. Marland Kitchen LDL-C is now calculated using the Martin-Hopkins  calculation, which is a validated novel method providing  better accuracy than the Friedewald equation in the  estimation of LDL-C.  Cresenciano Genre et al. Annamaria Helling. 2841;324(40): 2061-2068  (http://education.[facility].com/faq/FAQ164)    Total CHOL/HDL Ratio 3.9 <5.0 (calc)   Non-HDL  Cholesterol (Calc) 94 <130 mg/dL (calc)    Comment: For patients with diabetes plus 1 major ASCVD risk  factor, treating to a non-HDL-C goal of <100 mg/dL  (LDL-C of <70 mg/dL) is considered a therapeutic  option.    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 -She has currently controlled type 2 diabetes  since  62 years of age. -She is currently using the continuous glucose monitoring device with evident better engagement in monitoring blood glucose and covering her meals with insulin.  -He returns with better glycemic profile and A1c of 6.7% improving from 9.8%.     Her diabetes is complicated by end-stage renal disease on dialysis , retinopathy, and cerebrovascular accident and patient remains at a high risk for more acute and chronic complications of diabetes which include CAD, CVA, CKD, retinopathy, and neuropathy. These are all discussed in detail with the patient.  - I have counseled the patient on diet management and weight loss, by adopting a carbohydrate restricted/protein rich diet.  -  Suggestion is made for her to avoid simple carbohydrates  from her diet including Cakes, Sweet Desserts / Pastries, Ice Cream, Soda (diet and regular), Sweet Tea, Candies, Chips, Cookies, Store Bought Juices, Alcohol in Excess of  1-2 drinks a day, Artificial Sweeteners, and "Sugar-free" Products. This will help patient to have stable blood glucose profile and potentially avoid unintended weight gain.   - I encouraged the patient to switch to  unprocessed or minimally processed complex starch and increased protein intake (animal or plant source), fruits, and  vegetables.  - Patient is advised to stick to a routine mealtimes to eat 3 meals  a day  and avoid unnecessary snacks ( to snack only to correct hypoglycemia).    - I have approached patient with the following individualized plan to manage diabetes and patient agrees:   - Emphasizing the need for strict compliance, I advised her to decrease Toujeo to 20 units nightly, lower Humalog to 5  units 3 times daily before meals for pre-meal blood glucose readings  of 90-150mg /dl, plus patient specific correction dose for unexpected hyperglycemia above 150mg /dl, associated with strict monitoring of glucose  daily before meals and at bedtime. - Patient is warned not to take insulin without proper monitoring per orders. -Adjustment parameters are given for hypo and hyperglycemia in writing. -Patient is encouraged to call clinic for blood glucose levels less than 70 or above 300 mg /dl. -She is benefiting from the continuous glucose monitoring device.  She is advised to wear it at all times.  -Patient is not a candidate for metformin andSGLT2 inhibitors due to CKD.  - Patient specific target  A1c;  LDL, HDL, Triglycerides, and  Waist Circumference were discussed in detail.  2) BP/HTN: Her blood pressure is controlled to target.    I advised her to continue her current blood pressure medications including carvedilol 12.5 mg p.o. twice daily, lisinopril 10 mg p.o. daily.  She is also following with nephrology.   3) Lipids/HPL: Her recent lipid panel showed controlled LDL of 55.  He is advised to continue atorvastatin 40 mg p.o. Nightly.  4)  Weight/Diet: CDE Consult has been  initiated , exercise, and detailed carbohydrates information provided.  5) Chronic Care/Health Maintenance:  -Patient is on ACEI/ARB and Statin medications and encouraged to continue to follow up with Ophthalmology, Podiatrist at least yearly or according to recommendations, and advised to   stay away from smoking. I have recommended  yearly flu vaccine and pneumonia vaccination at least every 5 years; moderate intensity exercise for up to 150 minutes weekly; and  sleep for at least 7 hours a day.  I advised patient to maintain close follow up with her PCP for primary care needs. - Time spent with the patient: 25 min, of which >50% was spent in reviewing her blood glucose logs , discussing her hypo- and hyper-glycemic episodes, reviewing her current and  previous labs and insulin doses and developing a plan to avoid hypo- and hyper-glycemia. Please refer to Patient Instructions for Blood Glucose Monitoring and Insulin/Medications Dosing Guide"  in media tab for additional information. Selena Rowe participated in the discussions, expressed understanding, and voiced agreement with the above plans.  All questions were answered to her satisfaction. she is encouraged to contact clinic should she have any questions or concerns prior to her return visit.  Follow up plan: Return in about 3 months (around 01/12/2018) for Follow up with Pre-visit Labs, Meter, and Logs.  Glade Lloyd, MD Phone: [phone]  Fax: [phone]  -  This note was partially dictated with voice recognition software. Similar sounding words can be transcribed inadequately or may not  be corrected upon review.  10/12/2017, 1:13 PM

## 2017-10-28 ENCOUNTER — Encounter: Payer: Self-pay | Admitting: Vascular Surgery

## 2017-10-28 ENCOUNTER — Ambulatory Visit (INDEPENDENT_AMBULATORY_CARE_PROVIDER_SITE_OTHER): Payer: Medicare Other | Admitting: Vascular Surgery

## 2017-10-28 ENCOUNTER — Ambulatory Visit (HOSPITAL_COMMUNITY)
Admission: RE | Admit: 2017-10-28 | Discharge: 2017-10-28 | Disposition: A | Discharge status: Home / Self Care | Payer: Medicare Other | Source: Ambulatory Visit | Attending: Vascular Surgery | Admitting: Vascular Surgery

## 2017-10-28 ENCOUNTER — Other Ambulatory Visit: Payer: Self-pay

## 2017-10-28 VITALS — BP 160/82 | HR 76 | Temp 97.4°F | Resp 18 | Ht 64.0 in | Wt 166.6 lb

## 2017-10-28 DIAGNOSIS — I739 Peripheral vascular disease, unspecified: Secondary | ICD-10-CM

## 2017-10-28 DIAGNOSIS — E785 Hyperlipidemia, unspecified: Secondary | ICD-10-CM | POA: Diagnosis not present

## 2017-10-28 DIAGNOSIS — I10 Essential (primary) hypertension: Secondary | ICD-10-CM | POA: Diagnosis not present

## 2017-10-28 DIAGNOSIS — E1151 Type 2 diabetes mellitus with diabetic peripheral angiopathy without gangrene: Secondary | ICD-10-CM | POA: Insufficient documentation

## 2017-10-28 NOTE — Progress Notes (Signed)
Patient is a 62 year old female who returns for follow-up today.  She was seen 6 months ago with complaints of claudication.  This occurs at about 1 block.  It is unchanged from 6 months ago.  Denies rest pain.  She has no nonhealing wounds.  Chronic medical problems remain diabetes hyperlipidemia hypertension all of which are currently stable.  She has end-stage renal disease and continues with peritoneal dialysis.  She does have a functioning AV fistula.  Review of systems: She denies chest pain.  She denies shortness of breath.   Past Medical History:  Diagnosis Date  . Diabetes mellitus, type II ([place])   . Hyperlipidemia   . Hypertension   . Kidney disease    on diaylsis since 2016  . Stroke [facility]) 2014   Physical exam:   Vitals:   10/28/17 1206 10/28/17 1210  BP: (!) 184/83 (!) 160/82  Pulse: 76   Resp: 18   Temp: (!) 97.4 F (36.3 C)   TempSrc: Oral   SpO2: 100%   Weight: 166 lb 9.6 oz (75.6 kg)   Height: 5\' 4"  (1.626 m)     Chest: Clear to auscultation bilaterally  Cardiac: Regular rate and rhythm without murmur  Extremities: No palpable popliteal or pedal pulses bilaterally 2+ femoral pulses  Data: Patient had bilateral ABIs performed today which were monophasic on the left and right.  TBI was 0.56 on the right 0.36 on the left.  Arteries were calcified noncompressible.  I reviewed and interpreted the study.  Assessment: Bilateral lower extremity peripheral arterial disease.  I discussed again with the patient today the possibility of an arteriogram intervention.  However she wishes to opt for conservative management currently with a walking program of 30 minutes daily.  She will follow-up in 6 months time with repeat toe pressures.  She will see our nurse practitioner at that office visit.  Ruta Hinds, MD [facility] Office: [phone] Pager: [phone]

## 2018-01-17 ENCOUNTER — Ambulatory Visit: Payer: PRIVATE HEALTH INSURANCE | Admitting: "Endocrinology

## 2018-01-24 ENCOUNTER — Encounter: Payer: Self-pay | Admitting: Gastroenterology

## 2018-02-03 ENCOUNTER — Encounter: Payer: Self-pay | Admitting: Gastroenterology

## 2018-02-03 ENCOUNTER — Ambulatory Visit (INDEPENDENT_AMBULATORY_CARE_PROVIDER_SITE_OTHER): Payer: Medicare Other | Admitting: Gastroenterology

## 2018-02-03 DIAGNOSIS — D649 Anemia, unspecified: Secondary | ICD-10-CM

## 2018-02-03 MED ORDER — LANSOPRAZOLE 30 MG PO CPDR: DELAYED_RELEASE_CAPSULE | ORAL | 11 refills | Status: AC

## 2018-02-03 NOTE — Patient Instructions (Addendum)
TO PREVENT ULCERS AND TREAT GASTRITIS DUE TO DAILY ASPIRIN USE, ADD PREVACID. TAKE 30 MINUTES PRIOR TO BREAKFAST.  PLEASE CALL WITH QUESTIONS OR CONCERNS.  RECHECK CBC AND FERRITIN IN [facility] 2020.  FOLLOW UP IN 6 MOS.

## 2018-02-03 NOTE — Assessment & Plan Note (Addendum)
NO BRBPR OR MELENA. PT HAS HAD TCS x2, EGD x2 AND GIVENS CAPSULE STUDY x1 SINCE 2015 FOR IRON DEFICIENCY ANEMIA. ANEMIA MOST LIKELY DUE TO CHRONIC DISEASE AND EXACERBATED BY ASA/PLAVIX IN SETTING OF LIKELY GASTRITIS.Marland Kitchen  PLEASE CALL WITH QUESTIONS OR CONCERNS: RECTAL BLEEDING OR MELENA. DISCUSSED WITH PT WHAT MELENA LOOKS LIKE. TO PREVENT GASTRITIS/BLOOD LOSS AND ULCERS, ADD PREVACID DAILY. RECHECK CBC AND FERRITIN N [facility] 2020. NO INDICATION FOR ENDOSCOPY AT THIS TIME. OBTAIN VIRAL SEROLOGIES FROM [facility]. FOLLOW UP IN 6 MOS.

## 2018-02-03 NOTE — Progress Notes (Signed)
ON RECALL  °

## 2018-02-03 NOTE — Progress Notes (Signed)
Subjective:    Patient ID: Selena Rowe, female    DOB: 1955-08-31, 62 y.o.   MRN: [id number]  Dhivianathan, Candida Peeling, MD   HPI Pt denies black tarry stool. 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 YESTERDAY IT PICKED UP. WAS DOING PERITONEAL DIALYSIS UNTIL A COUPLE WEEKS AGO. UP UNTIL LAST WEEK HAD DIARRHEA ALL THE TIME AND COULD HARDLY MAKE IT TO THE BATHROOM. NOW HAVING BSC #1-4 AND STOOLS HAVE BEEN DARK BUT NOT BLACK. HAS A LOT OF FLATULENCE, GRUMBLING AND RUMBLING.  PT DENIES FEVER, CHILLS, HEMATOCHEZIA, HEMATEMESIS, vomiting, melena, CHEST PAIN, SHORTNESS OF BREATH, CHANGE IN BOWEL IN HABITS, abdominal pain, problems swallowing, problems with sedation, OR heartburn or indigestion.  Past Medical History:  Diagnosis Date  . Diabetes mellitus, type II ([place])   . Hyperlipidemia   . Hypertension   . Kidney disease    on diaylsis since 2016  . Stroke [facility]) 2014    Past Surgical History:  Procedure Laterality Date  . ABDOMINAL HYSTERECTOMY    . AGILE CAPSULE N/A 09/14/2016   Procedure: AGILE CAPSULE;  Surgeon: Danie Binder, MD;  [facility]: [facility];  Service: Endoscopy;  Laterality: N/A;  . BIOPSY  09/09/2016   Procedure: BIOPSY;  Surgeon: Danie Binder, MD;  [facility]: [facility];  Service: Endoscopy;;  duodenal;gastric  . CESAREAN SECTION    . COLONOSCOPY  09/2013   Dr. Earley Brooke: Performed for IDA due to blood loss, normal colonoscopy  . COLONOSCOPY  12/2005   Dr. Earley Brooke: hemorrhoids  . COLONOSCOPY N/A 09/09/2016   Procedure: COLONOSCOPY;  Surgeon: Danie Binder, MD;  [facility]: [facility];  Service: Endoscopy;  Laterality: N/A;  1015  . DIALYSIS FISTULA CREATION    . ESOPHAGOGASTRODUODENOSCOPY  02/2014   Dr. Earley Brooke: Hiatal hernia, few erosions of the antrum status post biopsy for clotest, small bowel biopsy showed mild chronic inflammation of the duodenum with changes of celiac disease not seen  . ESOPHAGOGASTRODUODENOSCOPY   12/2005   Dr. Alethia Berthold, Hiatal hernia  . ESOPHAGOGASTRODUODENOSCOPY N/A 09/09/2016   Procedure: ESOPHAGOGASTRODUODENOSCOPY (EGD);  Surgeon: Danie Binder, MD;  [facility];  Service: Endoscopy;  Laterality: N/A;  . GIVENS CAPSULE STUDY N/A 09/21/2016   Procedure: GIVENS CAPSULE STUDY;  Surgeon: Danie Binder, MD;  [facility];  Service: Endoscopy;  Laterality: N/A;  7:30am, pt to arrive at 7:00am  . POLYPECTOMY  09/09/2016   Procedure: POLYPECTOMY;  Surgeon: Danie Binder, MD;  [facility];  Service: Endoscopy;;  cecal and hepatic flexure polyp  . TUBAL LIGATION     Allergies  Allergen Reactions  . Amlodipine Swelling   Current Outpatient Medications  Medication Sig    . acetaminophen (TYLENOL) 500 MG tablet Take 1,000-1,500 mg by mouth at bedtime as needed for moderate pain or headache.    Marland Kitchen atorvastatin (LIPITOR) 40 MG tablet Take 40 mg by mouth every evening.     . calcitRIOL (ROCALTROL) 0.25 MCG capsule Take 0.25 mcg by mouth every Monday, Wednesday, and Friday.     . carvedilol (COREG) 25 MG tablet Take 25 mg by mouth 2 (two) times daily.    . cinacalcet (SENSIPAR) 90 MG tablet Take 90 mg by mouth daily.    . clopidogrel (PLAVIX) 75 MG tablet Take 75 mg by mouth daily.    . Continuous Blood Gluc Sensor (FREESTYLE LIBRE 14 DAY SENSOR) MISC Inject 1 each into the skin every  14 (fourteen) days. Use as directed.    . Cyanocobalamin 2500 MCG CHEW Chew by mouth daily.    Marland Kitchen doxazosin (CARDURA) 1 MG tablet Take 1 mg by mouth at bedtime.     . gabapentin (NEURONTIN) 100 MG capsule Take 100 mg by mouth daily as needed (pain).     . hydrALAZINE (APRESOLINE) 50 MG tablet Take 50 mg by mouth as needed.     . Insulin Glargine (TOUJEO SOLOSTAR) 300 UNIT/ML SOPN Inject 20 Units into the skin at bedtime.    . insulin lispro (HUMALOG KWIKPEN) 100 UNIT/ML KiwkPen Inject 5-11 Units into the skin 3 (three) times daily before meals.    . Insulin Pen Needle (B-D  ULTRAFINE III SHORT PEN) 31G X 8 MM MISC Use as directed 4 x daily    . montelukast (SINGULAIR) 10 MG tablet Take 10 mg by mouth at bedtime.    . multivitamin (RENA-VIT) TABS tablet Take 1 tablet by mouth daily.    . sevelamer carbonate (RENVELA) 800 MG tablet Take 1 tablet by mouth 3 (three) times daily.    . sucroferric oxyhydroxide (VELPHORO) 500 MG chewable tablet Chew 500 mg by mouth 3 (three) times daily with meals.    Marland Kitchen aspirin EC 81 MG tablet Take 81 mg by mouth daily.    . cinacalcet (SENSIPAR) 30 MG tablet Take 30 mg by mouth daily.    . ergocalciferol (VITAMIN D2) 50000 UNITS capsule Take 50,000 Units by mouth every 30 (thirty) days.       Review of Systems PER HPI OTHERWISE ALL SYSTEMS ARE NEGATIVE.    Objective:   Physical Exam Vitals signs reviewed.  Constitutional:      General: She is not in acute distress.    Appearance: She is well-developed.  HENT:     Head: Normocephalic and atraumatic.     Mouth/Throat:     Pharynx: No oropharyngeal exudate.  Eyes:     General: No scleral icterus.    Pupils: Pupils are equal, round, and reactive to light.  Neck:     Musculoskeletal: Normal range of motion and neck supple.  Cardiovascular:     Rate and Rhythm: Normal rate and regular rhythm.     Heart sounds: Normal heart sounds.  Pulmonary:     Effort: Pulmonary effort is normal. No respiratory distress.     Breath sounds: Normal breath sounds.  Abdominal:     General: Bowel sounds are normal. There is no distension.     Palpations: Abdomen is soft.     Tenderness: There is no abdominal tenderness.  Lymphadenopathy:     Cervical: No cervical adenopathy.  Neurological:     Mental Status: She is alert and oriented to person, place, and time.       Assessment & Plan:

## 2018-02-03 NOTE — Progress Notes (Signed)
cc'd to pcp 

## 2018-02-25 ENCOUNTER — Ambulatory Visit: Payer: PRIVATE HEALTH INSURANCE | Admitting: "Endocrinology

## 2018-03-12 IMAGING — DX DG ABDOMEN 1V
2 series · 2 of 2 positions shown · non-contrast
Comparison: None.

CLINICAL DATA: Patient ingested an agile capsule yesterday in
anticipation of a possible Givens capsule evaluation.

EXAM:
ABDOMEN - 1 VIEW

[abdomen kub (1 of 2)]
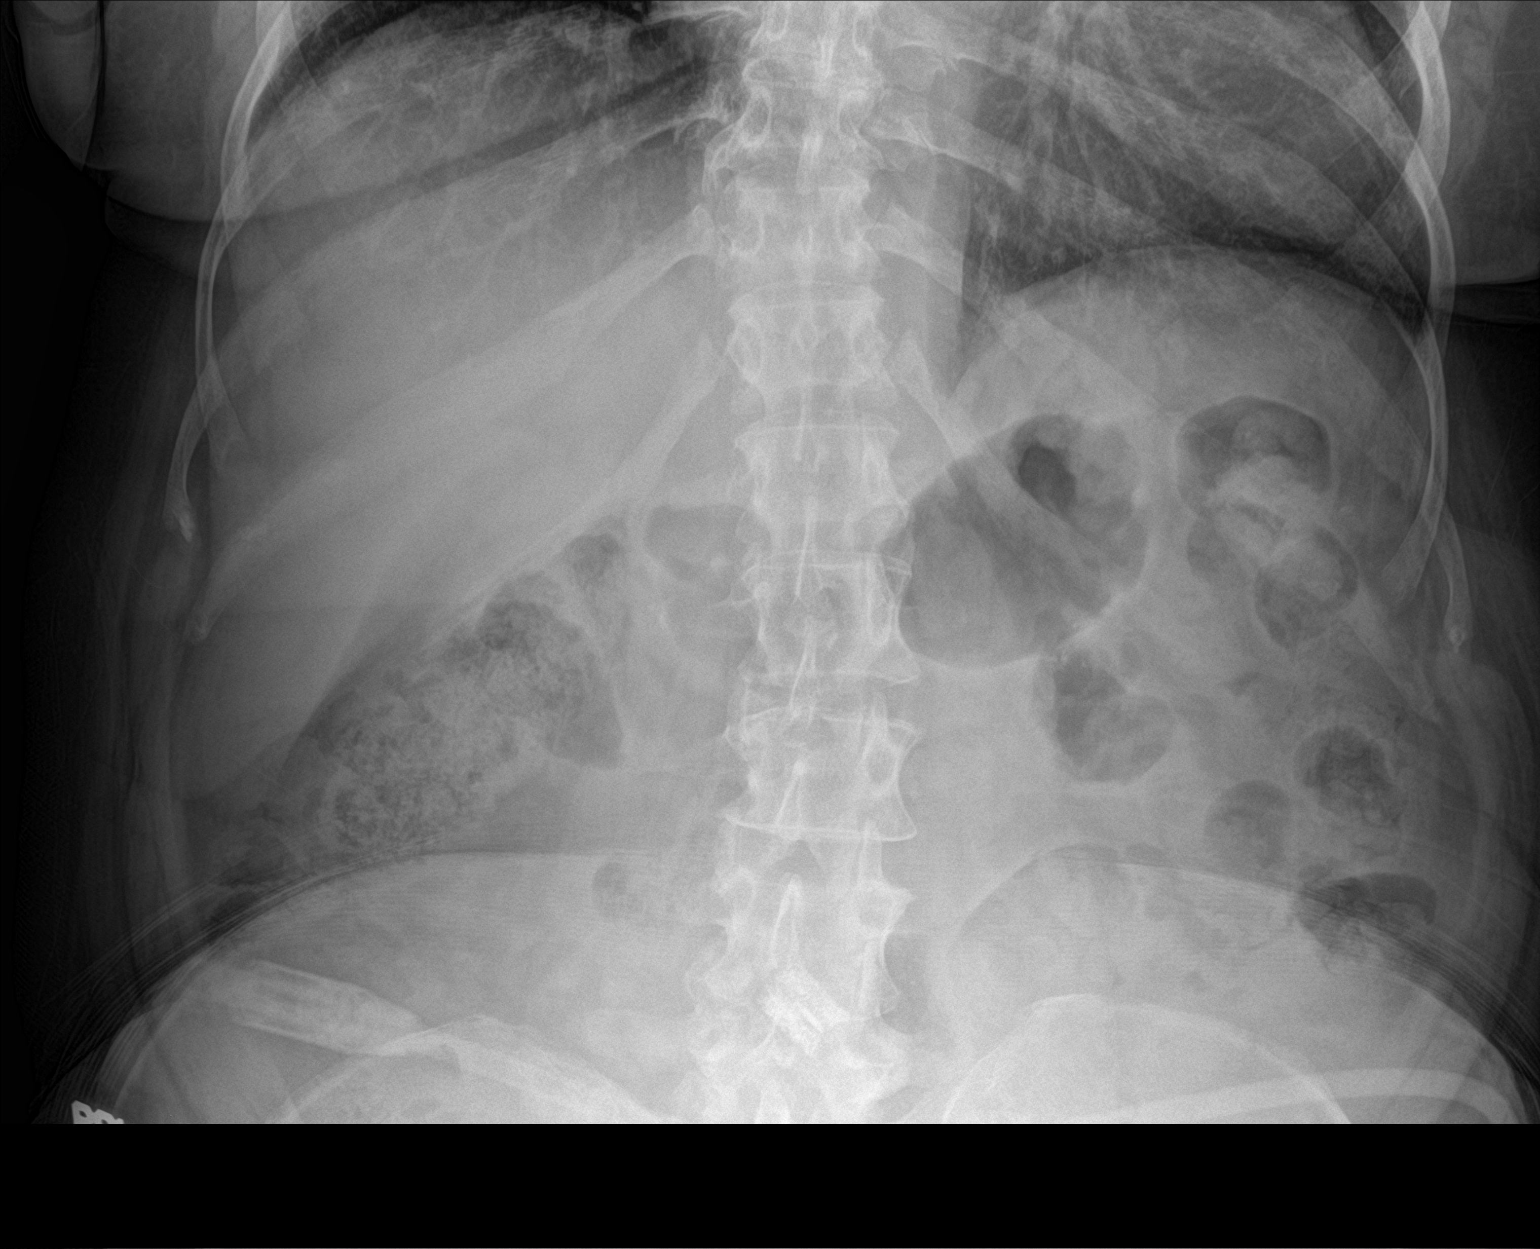

[abdomen kub (2 of 2)]
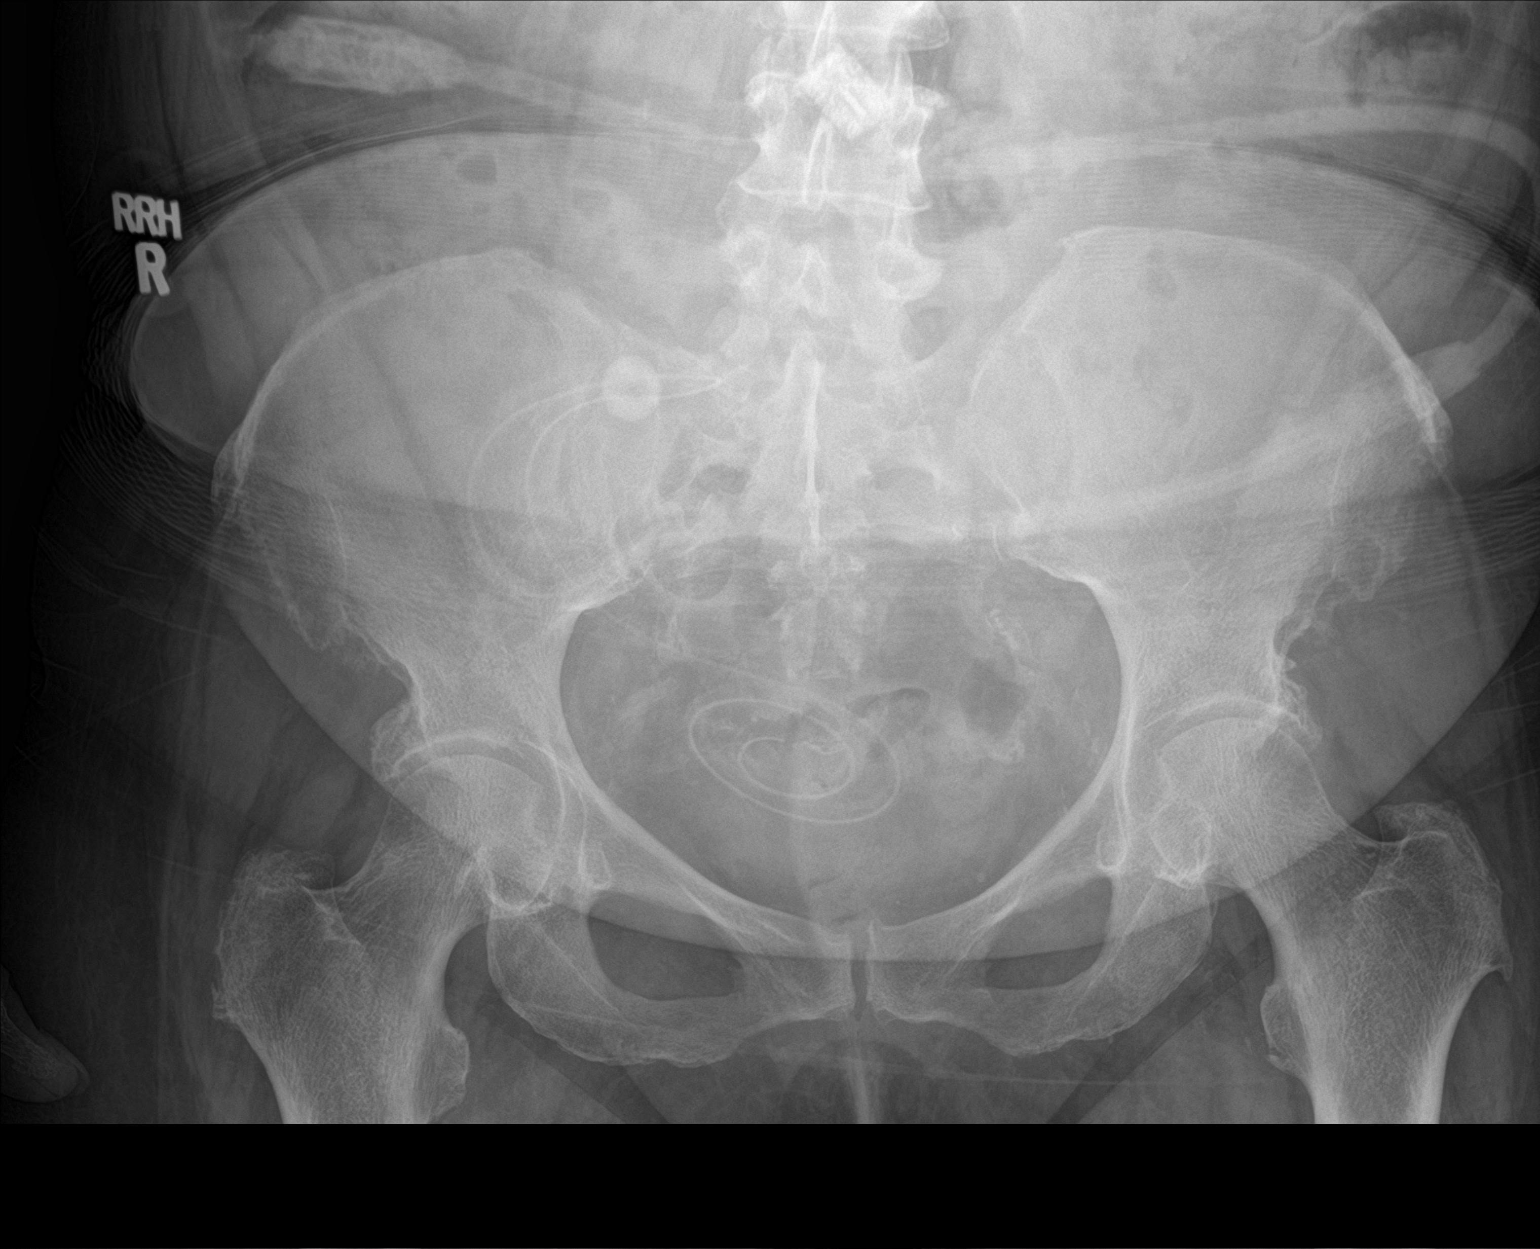

[2 of 2 positions shown; findings below may reference images not displayed]

FINDINGS: The agile capsule projects over the mid abdomen in the midline,
likely in the distal jejunum or proximal ileum. Bowel gas pattern
unremarkable. Moderate colonic stool burden. Peritoneal dialysis
catheter noted with its tip looped in the pelvis.
IMPRESSION: The agile capsule projects over the mid abdomen in the midline,
likely in the distal jejunum or proximal ileum.

## 2018-04-25 ENCOUNTER — Other Ambulatory Visit: Payer: Self-pay

## 2018-04-25 DIAGNOSIS — I739 Peripheral vascular disease, unspecified: Secondary | ICD-10-CM

## 2018-04-28 ENCOUNTER — Ambulatory Visit: Payer: PRIVATE HEALTH INSURANCE | Admitting: Family

## 2018-04-28 ENCOUNTER — Encounter (HOSPITAL_COMMUNITY): Payer: PRIVATE HEALTH INSURANCE

## 2018-05-30 ENCOUNTER — Telehealth: Payer: Self-pay | Admitting: "Endocrinology

## 2018-05-30 NOTE — Telephone Encounter (Signed)
Pt is requesting refills on all medication and test strips that she has been prescribed by [place]. Pt was last seen Aug-2019. I had advised her to call PCP for refills until she is seen by Dr Dorris Fetch. . However she keeps requesting this from [place].

## 2018-05-31 ENCOUNTER — Other Ambulatory Visit: Payer: Self-pay | Admitting: "Endocrinology

## 2018-05-31 MED ORDER — INSULIN LISPRO (1 UNIT DIAL) 100 UNIT/ML (KWIKPEN): 5.0000 [IU] | PEN_INJECTOR | Freq: Three times a day (TID) | SUBCUTANEOUS | 0 refills | Status: AC

## 2018-05-31 MED ORDER — TOUJEO SOLOSTAR 300 UNIT/ML ~~LOC~~ SOPN: 20.0000 [IU] | PEN_INJECTOR | Freq: Every day | SUBCUTANEOUS | 0 refills | Status: AC

## 2018-05-31 MED ORDER — INSULIN PEN NEEDLE 31G X 8 MM MISC: 0 refills | Status: AC

## 2018-05-31 NOTE — Telephone Encounter (Signed)
She gets 30 days supply of her insulin and testing supplies and has to reschedule if she wants to come back.

## 2018-05-31 NOTE — Telephone Encounter (Signed)
Rx sent 

## 2018-08-02 ENCOUNTER — Encounter: Payer: Self-pay | Admitting: Gastroenterology

## 2018-08-17 ENCOUNTER — Other Ambulatory Visit: Payer: Self-pay | Admitting: "Endocrinology

## 2018-11-27 ENCOUNTER — Other Ambulatory Visit: Payer: Self-pay | Admitting: "Endocrinology

## 2019-04-24 DEATH — deceased

## 2021-08-14 ENCOUNTER — Encounter: Payer: Self-pay | Admitting: *Deleted
